# Patient Record
Sex: Female | Born: 1980 | Race: Black or African American | Hispanic: No | Marital: Single | Smoking: Never smoker
Health system: Southern US, Community
[De-identification: ages and names within clinical notes are randomized; demographics above are authoritative.]

## PROBLEM LIST (undated history)

## (undated) DIAGNOSIS — J45909 Unspecified asthma, uncomplicated: Secondary | ICD-10-CM

## (undated) DIAGNOSIS — I499 Cardiac arrhythmia, unspecified: Secondary | ICD-10-CM

## (undated) DIAGNOSIS — R011 Cardiac murmur, unspecified: Secondary | ICD-10-CM

## (undated) HISTORY — PX: TUBAL LIGATION: SHX77

## (undated) HISTORY — PX: TONSILLECTOMY: SUR1361

## (undated) HISTORY — PX: BACK SURGERY: SHX140

---
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
## 2002-09-03 ENCOUNTER — Emergency Department (HOSPITAL_COMMUNITY): Admission: EM | Admit: 2002-09-03 | Discharge: 2002-09-03 | Payer: Self-pay | Admitting: Emergency Medicine

## 2003-07-02 ENCOUNTER — Inpatient Hospital Stay (HOSPITAL_COMMUNITY): Admission: AD | Admit: 2003-07-02 | Discharge: 2003-07-02 | Payer: Self-pay | Admitting: Obstetrics and Gynecology

## 2003-12-16 ENCOUNTER — Ambulatory Visit: Payer: Self-pay | Admitting: Family Medicine

## 2003-12-16 ENCOUNTER — Other Ambulatory Visit: Admission: RE | Admit: 2003-12-16 | Discharge: 2003-12-16 | Payer: Self-pay | Admitting: Family Medicine

## 2003-12-16 ENCOUNTER — Ambulatory Visit (HOSPITAL_COMMUNITY): Admission: RE | Admit: 2003-12-16 | Discharge: 2003-12-16 | Payer: Self-pay | Admitting: Family Medicine

## 2004-01-19 ENCOUNTER — Ambulatory Visit: Payer: Self-pay | Admitting: Internal Medicine

## 2004-02-07 ENCOUNTER — Ambulatory Visit: Payer: Self-pay | Admitting: Family Medicine

## 2004-03-07 ENCOUNTER — Ambulatory Visit: Payer: Self-pay | Admitting: Family Medicine

## 2004-05-04 ENCOUNTER — Ambulatory Visit: Payer: Self-pay | Admitting: Sports Medicine

## 2004-06-12 ENCOUNTER — Ambulatory Visit: Payer: Self-pay | Admitting: Family Medicine

## 2004-06-13 ENCOUNTER — Ambulatory Visit: Payer: Self-pay | Admitting: Family Medicine

## 2004-06-15 ENCOUNTER — Ambulatory Visit: Payer: Self-pay | Admitting: Family Medicine

## 2004-08-03 ENCOUNTER — Ambulatory Visit: Payer: Self-pay | Admitting: Internal Medicine

## 2004-08-18 ENCOUNTER — Inpatient Hospital Stay (HOSPITAL_COMMUNITY): Admission: AD | Admit: 2004-08-18 | Discharge: 2004-08-18 | Payer: Self-pay | Admitting: Obstetrics & Gynecology

## 2004-08-30 ENCOUNTER — Emergency Department (HOSPITAL_COMMUNITY): Admission: EM | Admit: 2004-08-30 | Discharge: 2004-08-30 | Payer: Self-pay | Admitting: *Deleted

## 2004-09-24 ENCOUNTER — Inpatient Hospital Stay (HOSPITAL_COMMUNITY): Admission: AD | Admit: 2004-09-24 | Discharge: 2004-09-24 | Payer: Self-pay | Admitting: Family Medicine

## 2004-09-26 ENCOUNTER — Observation Stay (HOSPITAL_COMMUNITY): Admission: AD | Admit: 2004-09-26 | Discharge: 2004-09-27 | Payer: Self-pay | Admitting: Obstetrics and Gynecology

## 2004-09-26 ENCOUNTER — Ambulatory Visit: Payer: Self-pay | Admitting: Obstetrics and Gynecology

## 2004-10-02 ENCOUNTER — Ambulatory Visit: Payer: Self-pay | Admitting: Obstetrics and Gynecology

## 2004-10-18 ENCOUNTER — Inpatient Hospital Stay (HOSPITAL_COMMUNITY): Admission: AD | Admit: 2004-10-18 | Discharge: 2004-10-18 | Payer: Self-pay | Admitting: *Deleted

## 2004-10-31 ENCOUNTER — Inpatient Hospital Stay (HOSPITAL_COMMUNITY): Admission: AD | Admit: 2004-10-31 | Discharge: 2004-10-31 | Payer: Self-pay | Admitting: Obstetrics and Gynecology

## 2004-11-08 ENCOUNTER — Ambulatory Visit: Payer: Self-pay | Admitting: Family Medicine

## 2004-11-15 ENCOUNTER — Ambulatory Visit: Payer: Self-pay | Admitting: Sports Medicine

## 2004-11-21 ENCOUNTER — Ambulatory Visit: Payer: Self-pay | Admitting: Internal Medicine

## 2004-12-18 ENCOUNTER — Ambulatory Visit: Payer: Self-pay | Admitting: Family Medicine

## 2005-01-01 ENCOUNTER — Inpatient Hospital Stay (HOSPITAL_COMMUNITY): Admission: AD | Admit: 2005-01-01 | Discharge: 2005-01-01 | Payer: Self-pay | Admitting: *Deleted

## 2005-01-07 ENCOUNTER — Ambulatory Visit (HOSPITAL_COMMUNITY): Admission: RE | Admit: 2005-01-07 | Discharge: 2005-01-07 | Payer: Self-pay | Admitting: Obstetrics and Gynecology

## 2005-01-14 ENCOUNTER — Inpatient Hospital Stay (HOSPITAL_COMMUNITY): Admission: AD | Admit: 2005-01-14 | Discharge: 2005-01-14 | Payer: Self-pay | Admitting: Obstetrics and Gynecology

## 2005-01-17 ENCOUNTER — Ambulatory Visit: Payer: Self-pay | Admitting: Family Medicine

## 2005-01-18 ENCOUNTER — Ambulatory Visit: Payer: Self-pay | Admitting: Family Medicine

## 2005-01-30 ENCOUNTER — Ambulatory Visit: Payer: Self-pay | Admitting: Internal Medicine

## 2005-02-05 ENCOUNTER — Inpatient Hospital Stay (HOSPITAL_COMMUNITY): Admission: AD | Admit: 2005-02-05 | Discharge: 2005-02-06 | Payer: Self-pay | Admitting: *Deleted

## 2005-02-05 ENCOUNTER — Ambulatory Visit: Payer: Self-pay | Admitting: *Deleted

## 2005-02-12 ENCOUNTER — Ambulatory Visit: Payer: Self-pay | Admitting: Family Medicine

## 2005-02-28 ENCOUNTER — Encounter: Payer: Self-pay | Admitting: Internal Medicine

## 2005-02-28 ENCOUNTER — Ambulatory Visit: Payer: Self-pay | Admitting: Internal Medicine

## 2005-02-28 ENCOUNTER — Ambulatory Visit (HOSPITAL_COMMUNITY): Admission: RE | Admit: 2005-02-28 | Discharge: 2005-02-28 | Payer: Self-pay | Admitting: Sports Medicine

## 2005-03-14 ENCOUNTER — Ambulatory Visit: Payer: Self-pay | Admitting: Family Medicine

## 2005-03-19 ENCOUNTER — Ambulatory Visit: Payer: Self-pay | Admitting: Family Medicine

## 2005-03-19 ENCOUNTER — Inpatient Hospital Stay (HOSPITAL_COMMUNITY): Admission: AD | Admit: 2005-03-19 | Discharge: 2005-03-19 | Payer: Self-pay | Admitting: Family Medicine

## 2005-03-27 ENCOUNTER — Ambulatory Visit: Payer: Self-pay | Admitting: Family Medicine

## 2005-03-30 ENCOUNTER — Emergency Department (HOSPITAL_COMMUNITY): Admission: EM | Admit: 2005-03-30 | Discharge: 2005-03-30 | Payer: Self-pay | Admitting: Emergency Medicine

## 2005-04-26 ENCOUNTER — Ambulatory Visit: Payer: Self-pay | Admitting: Family Medicine

## 2005-05-08 ENCOUNTER — Ambulatory Visit: Payer: Self-pay | Admitting: Family Medicine

## 2005-05-14 ENCOUNTER — Inpatient Hospital Stay (HOSPITAL_COMMUNITY): Admission: AD | Admit: 2005-05-14 | Discharge: 2005-05-14 | Payer: Self-pay | Admitting: *Deleted

## 2005-05-24 ENCOUNTER — Ambulatory Visit: Payer: Self-pay | Admitting: Sports Medicine

## 2005-05-28 ENCOUNTER — Ambulatory Visit: Payer: Self-pay | Admitting: *Deleted

## 2005-05-29 ENCOUNTER — Ambulatory Visit: Payer: Self-pay | Admitting: Family Medicine

## 2005-06-03 ENCOUNTER — Ambulatory Visit: Payer: Self-pay | Admitting: Obstetrics and Gynecology

## 2005-06-03 ENCOUNTER — Inpatient Hospital Stay (HOSPITAL_COMMUNITY): Admission: AD | Admit: 2005-06-03 | Discharge: 2005-06-03 | Payer: Self-pay | Admitting: Family Medicine

## 2005-06-04 ENCOUNTER — Inpatient Hospital Stay (HOSPITAL_COMMUNITY): Admission: AD | Admit: 2005-06-04 | Discharge: 2005-06-08 | Payer: Self-pay | Admitting: Obstetrics and Gynecology

## 2005-06-04 ENCOUNTER — Ambulatory Visit: Payer: Self-pay | Admitting: Obstetrics and Gynecology

## 2005-06-05 ENCOUNTER — Encounter (INDEPENDENT_AMBULATORY_CARE_PROVIDER_SITE_OTHER): Payer: Self-pay | Admitting: *Deleted

## 2005-06-05 ENCOUNTER — Ambulatory Visit: Payer: Self-pay | Admitting: Internal Medicine

## 2005-08-13 ENCOUNTER — Ambulatory Visit: Payer: Self-pay | Admitting: Sports Medicine

## 2005-10-14 ENCOUNTER — Ambulatory Visit: Payer: Self-pay | Admitting: Family Medicine

## 2006-04-01 ENCOUNTER — Encounter (INDEPENDENT_AMBULATORY_CARE_PROVIDER_SITE_OTHER): Payer: Self-pay | Admitting: *Deleted

## 2006-04-01 LAB — CONVERTED CEMR LAB

## 2006-04-09 ENCOUNTER — Other Ambulatory Visit: Admission: RE | Admit: 2006-04-09 | Discharge: 2006-04-09 | Payer: Self-pay | Admitting: Family Medicine

## 2006-04-09 ENCOUNTER — Ambulatory Visit: Payer: Self-pay | Admitting: Sports Medicine

## 2006-04-09 ENCOUNTER — Encounter: Payer: Self-pay | Admitting: Family Medicine

## 2006-04-09 LAB — CONVERTED CEMR LAB
Chlamydia, DNA Probe: POSITIVE — AB
GC Probe Amp, Genital: NEGATIVE

## 2006-04-11 ENCOUNTER — Ambulatory Visit: Payer: Self-pay | Admitting: Family Medicine

## 2006-05-02 ENCOUNTER — Inpatient Hospital Stay (HOSPITAL_COMMUNITY): Admission: AD | Admit: 2006-05-02 | Discharge: 2006-05-02 | Payer: Self-pay | Admitting: Family Medicine

## 2006-05-23 ENCOUNTER — Encounter (INDEPENDENT_AMBULATORY_CARE_PROVIDER_SITE_OTHER): Payer: Self-pay | Admitting: *Deleted

## 2006-08-01 ENCOUNTER — Telehealth: Payer: Self-pay | Admitting: Family Medicine

## 2006-08-01 ENCOUNTER — Ambulatory Visit: Payer: Self-pay | Admitting: Sports Medicine

## 2006-08-01 ENCOUNTER — Encounter: Payer: Self-pay | Admitting: Family Medicine

## 2006-08-01 LAB — CONVERTED CEMR LAB
Chlamydia, DNA Probe: NEGATIVE
GC Probe Amp, Genital: NEGATIVE
Whiff Test: POSITIVE

## 2006-08-04 ENCOUNTER — Encounter: Payer: Self-pay | Admitting: Family Medicine

## 2006-09-04 ENCOUNTER — Encounter: Payer: Self-pay | Admitting: Family Medicine

## 2006-12-22 ENCOUNTER — Emergency Department (HOSPITAL_COMMUNITY): Admission: EM | Admit: 2006-12-22 | Discharge: 2006-12-22 | Payer: Self-pay | Admitting: Emergency Medicine

## 2006-12-24 ENCOUNTER — Ambulatory Visit: Payer: Self-pay | Admitting: Internal Medicine

## 2006-12-26 ENCOUNTER — Ambulatory Visit: Payer: Self-pay | Admitting: Family Medicine

## 2006-12-26 ENCOUNTER — Encounter: Payer: Self-pay | Admitting: Family Medicine

## 2006-12-26 DIAGNOSIS — J351 Hypertrophy of tonsils: Secondary | ICD-10-CM | POA: Insufficient documentation

## 2006-12-26 LAB — CONVERTED CEMR LAB: TSH: 1.31 microintl units/mL (ref 0.350–5.50)

## 2007-04-21 ENCOUNTER — Encounter: Payer: Self-pay | Admitting: *Deleted

## 2007-06-25 ENCOUNTER — Ambulatory Visit: Payer: Self-pay | Admitting: Family Medicine

## 2007-06-25 ENCOUNTER — Encounter (INDEPENDENT_AMBULATORY_CARE_PROVIDER_SITE_OTHER): Payer: Self-pay | Admitting: Family Medicine

## 2007-06-25 LAB — CONVERTED CEMR LAB
Chlamydia, DNA Probe: NEGATIVE
GC Probe Amp, Genital: NEGATIVE
Whiff Test: POSITIVE

## 2007-06-30 ENCOUNTER — Ambulatory Visit: Payer: Self-pay | Admitting: Family Medicine

## 2007-06-30 ENCOUNTER — Encounter (INDEPENDENT_AMBULATORY_CARE_PROVIDER_SITE_OTHER): Payer: Self-pay | Admitting: Family Medicine

## 2007-06-30 LAB — CONVERTED CEMR LAB: Rapid Strep: NEGATIVE

## 2007-08-04 ENCOUNTER — Encounter: Payer: Self-pay | Admitting: Family Medicine

## 2007-08-24 ENCOUNTER — Encounter (INDEPENDENT_AMBULATORY_CARE_PROVIDER_SITE_OTHER): Payer: Self-pay | Admitting: Otolaryngology

## 2007-08-24 ENCOUNTER — Ambulatory Visit (HOSPITAL_BASED_OUTPATIENT_CLINIC_OR_DEPARTMENT_OTHER): Admission: RE | Admit: 2007-08-24 | Discharge: 2007-08-24 | Payer: Self-pay | Admitting: Otolaryngology

## 2007-10-09 ENCOUNTER — Telehealth: Payer: Self-pay | Admitting: *Deleted

## 2007-10-09 ENCOUNTER — Encounter: Payer: Self-pay | Admitting: *Deleted

## 2007-10-09 ENCOUNTER — Emergency Department (HOSPITAL_COMMUNITY): Admission: EM | Admit: 2007-10-09 | Discharge: 2007-10-09 | Payer: Self-pay | Admitting: Emergency Medicine

## 2007-10-12 ENCOUNTER — Emergency Department (HOSPITAL_COMMUNITY): Admission: EM | Admit: 2007-10-12 | Discharge: 2007-10-12 | Payer: Self-pay | Admitting: Emergency Medicine

## 2007-10-21 ENCOUNTER — Ambulatory Visit: Payer: Self-pay | Admitting: Family Medicine

## 2007-12-22 ENCOUNTER — Ambulatory Visit: Payer: Self-pay | Admitting: Family Medicine

## 2007-12-22 ENCOUNTER — Telehealth: Payer: Self-pay | Admitting: *Deleted

## 2007-12-22 LAB — CONVERTED CEMR LAB: Whiff Test: POSITIVE

## 2007-12-25 ENCOUNTER — Telehealth: Payer: Self-pay | Admitting: *Deleted

## 2008-01-03 ENCOUNTER — Emergency Department (HOSPITAL_COMMUNITY): Admission: EM | Admit: 2008-01-03 | Discharge: 2008-01-03 | Payer: Self-pay | Admitting: Emergency Medicine

## 2008-02-21 ENCOUNTER — Emergency Department (HOSPITAL_COMMUNITY): Admission: EM | Admit: 2008-02-21 | Discharge: 2008-02-21 | Payer: Self-pay | Admitting: Family Medicine

## 2008-03-02 ENCOUNTER — Telehealth: Payer: Self-pay | Admitting: Family Medicine

## 2008-05-26 ENCOUNTER — Telehealth: Payer: Self-pay | Admitting: Family Medicine

## 2008-05-26 ENCOUNTER — Ambulatory Visit: Payer: Self-pay | Admitting: Family Medicine

## 2008-05-26 DIAGNOSIS — G44009 Cluster headache syndrome, unspecified, not intractable: Secondary | ICD-10-CM | POA: Insufficient documentation

## 2008-06-23 ENCOUNTER — Telehealth: Payer: Self-pay | Admitting: Family Medicine

## 2008-06-24 ENCOUNTER — Ambulatory Visit: Payer: Self-pay | Admitting: Family Medicine

## 2008-06-24 DIAGNOSIS — J309 Allergic rhinitis, unspecified: Secondary | ICD-10-CM | POA: Insufficient documentation

## 2008-06-24 DIAGNOSIS — R51 Headache: Secondary | ICD-10-CM | POA: Insufficient documentation

## 2008-06-24 DIAGNOSIS — R519 Headache, unspecified: Secondary | ICD-10-CM | POA: Insufficient documentation

## 2009-03-01 ENCOUNTER — Telehealth: Payer: Self-pay | Admitting: Family Medicine

## 2009-03-06 ENCOUNTER — Telehealth: Payer: Self-pay | Admitting: Family Medicine

## 2009-03-09 ENCOUNTER — Encounter: Payer: Self-pay | Admitting: Family Medicine

## 2009-03-10 ENCOUNTER — Telehealth: Payer: Self-pay | Admitting: Family Medicine

## 2009-03-10 ENCOUNTER — Ambulatory Visit: Payer: Self-pay | Admitting: Family Medicine

## 2009-03-21 ENCOUNTER — Ambulatory Visit: Payer: Self-pay | Admitting: Family Medicine

## 2009-03-21 DIAGNOSIS — F410 Panic disorder [episodic paroxysmal anxiety] without agoraphobia: Secondary | ICD-10-CM | POA: Insufficient documentation

## 2009-04-27 ENCOUNTER — Telehealth: Payer: Self-pay | Admitting: Family Medicine

## 2009-05-17 ENCOUNTER — Telehealth: Payer: Self-pay | Admitting: Family Medicine

## 2009-05-17 ENCOUNTER — Encounter: Payer: Self-pay | Admitting: Family Medicine

## 2009-05-18 ENCOUNTER — Encounter: Payer: Self-pay | Admitting: Family Medicine

## 2009-06-05 ENCOUNTER — Telehealth: Payer: Self-pay | Admitting: Family Medicine

## 2009-06-06 ENCOUNTER — Encounter: Payer: Self-pay | Admitting: Family Medicine

## 2009-06-20 ENCOUNTER — Telehealth: Payer: Self-pay | Admitting: Family Medicine

## 2009-09-04 ENCOUNTER — Encounter: Payer: Self-pay | Admitting: Family Medicine

## 2009-11-25 ENCOUNTER — Encounter: Payer: Self-pay | Admitting: Family Medicine

## 2009-11-25 DIAGNOSIS — J45909 Unspecified asthma, uncomplicated: Secondary | ICD-10-CM | POA: Insufficient documentation

## 2010-01-05 ENCOUNTER — Emergency Department (HOSPITAL_COMMUNITY)
Admission: EM | Admit: 2010-01-05 | Discharge: 2010-01-05 | Payer: Self-pay | Source: Home / Self Care | Admitting: Emergency Medicine

## 2010-02-09 ENCOUNTER — Emergency Department (HOSPITAL_COMMUNITY): Admission: EM | Admit: 2010-02-09 | Discharge: 2010-02-09 | Payer: Self-pay | Admitting: Emergency Medicine

## 2010-04-24 NOTE — Assessment & Plan Note (Signed)
Summary: tonsils swollen/problems with asthma/eo  Medications Added VENTOLIN HFA 108 (90 BASE) MCG/ACT  AERS (ALBUTEROL SULFATE) 2 puffs every 4 hours as needed for wheezing or shortness of breath disp: 2        Vital Signs:  Patient Profile:   30 Years Old Female Height:     62 inches Weight:      214.2 pounds Temp:     98.9 degrees F Pulse rate:   84 / minute BP sitting:   114 / 80  (left arm)  Pt. in pain?   yes    Location:   throat    Intensity:   sore7    Type:       soreness  Vitals Entered By: Theresia Lo RN (June 30, 2007 11:34 AM)              Is Patient Diabetic? No     PCP:  Myrtie Soman  MD  Chief Complaint:  sore throat, left ear ache, and asthma.  History of Present Illness: Molly Ford is a 30 year old who has enlarged tonsils and a history of frequent strep infections who presents with swollen left tonsil and sore throat and left ear since last Friday.  Denies fevers or chills. Symptoms improving.  No stridor or dysphagia.  No drainage in throat or from ear.  No swollen painful nodes that patient can feel.   Also reports needing to use albuterol nebs every 4-6 hours for the past 2 days.  States cold air triggers some shortness of breath and wheezing. It is relieved by the albuterol.  States this is typical for her this time of year.  States this has also improved today        Physical Exam  General:     Well-developed,well-nourished,in no acute distress; alert,appropriate and cooperative throughout examination Ears:     External ear exam shows no significant lesions or deformities.  Otoscopic examination reveals clear canals, tympanic membranes are intact bilaterally without bulging, retraction, inflammation or discharge. Hearing is grossly normal bilaterally. Nose:     External nasal examination shows no deformity or inflammation. Nasal mucosa are pink and moist without lesions or exudates. Mouth:     no exudates, pharyngeal erythema, and  tonsil hypertropied.   Neck:     no lymphadenopathy supple and no neck tenderness.   Lungs:     Normal respiratory effort, chest expands symmetrically. Lungs are clear to auscultation, no crackles or wheezes. Heart:     Normal rate and regular rhythm. S1 and S2 normal without gallop, murmur, click, rub or other extra sounds. Extremities:     No clubbing, cyanosis, edema, or deformity noted  Skin:     Intact without suspicious lesions or rashes Cervical Nodes:     No lymphadenopathy noted    Impression & Recommendations:  Problem # 1:  SORE THROAT (ICD-462) Assessment: Deteriorated rapid strep negative.  will send for culture.  afebrile, otherwise asymptomatic.  No treatment needed at this time.  encouraged patient to schedule appointment with ENT to discuss tonsillectomy Her updated medication list for this problem includes:    Metronidazole 500 Mg Tabs (Metronidazole) .Marland Kitchen... 1 tablet twice a day for 7 days.  Orders: Rapid Strep-[facility] (56387) [facility]- Est Level  3 (56433)   Problem # 2:  ASTHMA, UNSPECIFIED (ICD-493.90) Assessment: Deteriorated no respiratory distress, no wheezing on exam.  Seems to be mild intermittent asthma.  Inhaler refilled.  Will follow up with PCP to discuss asthma  and ensure no controller med needed. Her updated medication list for this problem includes:    Ventolin Hfa 108 (90 Base) Mcg/act Aers (Albuterol sulfate) .Marland Kitchen... 2 puffs every 4 hours as needed for wheezing or shortness of breath disp: 2  Orders: Pulse Oximetry- [facility] (94760) [facility]- Est Level  3 (35361)   Complete Medication List: 1)  Verapamil Hcl Cr 120 Mg Cp24 (Verapamil hcl) .... One tab daily 2)  Metronidazole 500 Mg Tabs (Metronidazole) .Marland Kitchen.. 1 tablet twice a day for 7 days. 3)  Ventolin Hfa 108 (90 Base) Mcg/act Aers (Albuterol sulfate) .... 2 puffs every 4 hours as needed for wheezing or shortness of breath disp: 2   Patient Instructions: 1)  Follow up with Dr. Rexene Alberts within the next  month to discuss your asthma. 2)  Call the ENT for an appointment--if they have any questions, have them contact our office. 3)  Your strep test was negative today.    Prescriptions: VENTOLIN HFA 108 (90 BASE) MCG/ACT  AERS (ALBUTEROL SULFATE) 2 puffs every 4 hours as needed for wheezing or shortness of breath disp: 2  #1 x 3   Entered and Authorized by:   Levander Campion MD   Signed by:   Levander Campion MD on 06/30/2007   Method used:   Print then Give to Patient   RxID:   [id number] VENTOLIN HFA 108 (90 BASE) MCG/ACT  AERS (ALBUTEROL SULFATE) 2 puffs every 4 hours as needed for wheezing or shortness of breath disp: 2  #1 x 3   Entered and Authorized by:   Levander Campion MD   Signed by:   Levander Campion MD on 06/30/2007   Method used:   Print then Give to Patient   RxID:   [id number]  ]  Vital Signs:  Patient Profile:   30 Years Old Female Height:     62 inches Weight:      214.2 pounds Temp:     98.9 degrees F Pulse rate:   84 / minute BP sitting:   114 / 80    Location:   throat    Intensity:   sore7    Type:       soreness                Laboratory Results  Date/Time Received: June 30, 2007 11:47 AM  Date/Time Reported: June 30, 2007 11:55 AM   Other Tests  Rapid Strep: negative Comments: ...................................................................[facility]  June 30, 2007 11:55 AM

## 2010-04-24 NOTE — Letter (Signed)
Summary: Generic Letter  [facility]  [address]   [place], [place] [address]   Phone: [phone]  Fax:     09/04/2006  [address] [place], [place]  [address]  Dear Ms. 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  I have had the good fortune to work here since July of 2005.  This means my time here in the clinic is ending soon.  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  While I will still be working in [place], I will not be able to continue full time work at the [facility].    Fortunately, new doctors are coming into the practice.  You will be reassigned to a new doctor within the next month.  The staff in the clinic (our nurses and Museum/gallery curator) will not change.  You should still see familiar faces when you come to the clinic, even if you do not see me.  I have enjoyed my time at [facility] participating in your care.  Please call the clinic if you have any questions.  Thank you for allowing me to serve as your physician.  Sincerely,   Crawford Givens MD [facility]  Appended Document: Generic Letter letter sent  admin team
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
## 2010-04-24 NOTE — Miscellaneous (Signed)
Summary: chart update   Clinical Lists Changes  Medications: Removed medication of ATIVAN 0.5 MG TABS (LORAZEPAM) take one tablet 3-4 times a day as needed Removed medication of BACTROBAN NASAL 2 % OINT (MUPIROCIN CALCIUM) apply to both nares three time a day for 7 days, disp QS Observations: Added new observation of SOCIAL HX: 2 sons. Recently lost her youngest (28 yo) son unexpectedly 12/10 due to MRSA sepsis. Seemed to have appropriate grieving process.  (09/04/2009 11:49) Added new observation of PRIMARY MD: Myrtie Soman  MD (09/04/2009 11:49)        Social History: 2 sons. Recently lost her youngest (28 yo) son unexpectedly 12/10 due to MRSA sepsis. Seemed to have appropriate grieving process.

## 2010-04-24 NOTE — Progress Notes (Signed)
Summary: triage   Phone Note Call from Patient Call back at [phone]   Caller: Patient Summary of Call: Pt wants to come in with her son and be checked for MRSA since her other son just passed with it and Dr. Rexene Alberts said he would get them in asap. Initial call taken by: Clydell Hakim,  March 10, 2009 2:18 PM  Follow-up for Phone Call        I gave her my condolances. she has been researching MRSA & wants to be checked. has no open sores. also wants other son with exzema checked as he does have breaks in the skin. to be here before 3pm for a WORK IN. KNOWS PCP WILL NOT BE HERE Follow-up by: Golden Circle RN,  March 10, 2009 2:19 PM
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
## 2010-04-24 NOTE — Miscellaneous (Signed)
Summary: re: records request  Molly Ford comes in with Molly Ford requesting a form that summarized the events surrounding her son's unfortunate death from sepsis in 03-13-2023. She states that I gave her a form which summarized what we knew about Molly Ford's clinical course. I did give her an instruction sheet which summarized the basic details of Molly Ford's diagnoses death according to what we knew at that time. I do not know of any other sheet. I gave Molly Ford a copy of Molly Ford's death summary and also a copy of the emergency department note the day he came in to the hospital. They have tried to get a copy of the medical record from the hospital but state that they have been unsuccessful in getting these records so far. I stated that I would call medical records to see if they could provide any information re: this request. They expressed understanding and appreciation.

## 2010-04-24 NOTE — Letter (Signed)
Summary: Generic Letter  [facility]  [address]   [place], [place] [address]   Phone: [phone]  Fax:     08/04/2006  [address] [place], [place]  [address]  Dear Ms. BRIGHAM,  Your labs showed yeast and bacterial vaginosis- these are not STDs.  The medicines for both were called into the pharmacy.  Your other tests- gonorrhea and chlamydia- were both normal.    It was good to see you.  Take care and call me if you have any questions.  Sincerely,   Crawford Givens MD [facility]  Appended Document: Generic Letter Patient letter mailed

## 2010-04-24 NOTE — Progress Notes (Signed)
Summary: Triage   Phone Note Call from Patient Call back at Home Phone [phone]   Reason for Call: Talk to Nurse Summary of Call: wants to speak with rn about asthma Initial call taken by: Haydee Salter,  December 25, 2007 8:31 AM  Follow-up for Phone Call        states the albuterol pump is not helping at all. using every 10 minutes with no relief. her speech was clear and she spoke in full sentences with no apparent distress.  appt made Follow-up by: Golden Circle RN,  December 25, 2007 8:35 AM

## 2010-04-24 NOTE — Progress Notes (Signed)
Summary: triage   Phone Note Call from Patient Call back at Home Phone [phone]   Caller: Patient Summary of Call: migrains have started and needs to come in for a shot Initial call taken by: De Nurse,  June 23, 2008 3:13 PM  Follow-up for Phone Call        she just started a HA. wants early am appt. she will call if it is gone. states it is not bad now & has taken ibuprofen. is going to relax & see if it goes away. told her we have md on call after hours if worse or if she has questions.Golden Circle RN  June 23, 2008 3:31 PM appt made for 8:30 Follow-up by: Golden Circle RN,  June 23, 2008 3:19 PM

## 2010-04-24 NOTE — Assessment & Plan Note (Signed)
Summary: migraine   Vital Signs:  Patient Profile:   30 Years Old Female Height:     62 inches Weight:      223 pounds Temp:     98.6 degrees F Pulse rate:   93 / minute BP sitting:   128 / 84  Pt. in pain?   yes    Location:   left side of head    Intensity:   8  Vitals Entered By: Jone Baseman CMA (May 26, 2008 9:32 AM)                  PCP:  Myrtie Soman  MD  Chief Complaint:  migraine.  History of Present Illness: 30 yo presents as work in for migraine.  Says she has been having them for years, on and off.  Not taking preventative or abortive medications.  This headache started about 4 days ago, lasts about 20 minutes off and on.  Unilateral, left orbital, accompanied by tearing.  She is tearing in office today.  Denies any nausea, photophobia, or visual changes.  Is restless, would not like to be in a dark room.  Medical history and medicaitons reviewed.       Review of Systems      See HPI   Physical Exam  General:     alert, left  eye tearing, appears uncomfortable. Eyes:     pupils equal, pupils round, pupils reactive to light, bilateral conjunctival injection, no exudates. left eye tearing.   Neurologic:     No cranial nerve deficits noted. Station and gait are normal. Plantar reflexes are down-going bilaterally. DTRs are symmetrical throughout. Sensory, motor and coordinative functions appear intact.    Impression & Recommendations:  Problem # 1:  CLUSTER HEADACHE (ICD-339.00) May have migraines as well, but this headache is more consistent with a cluster headache, given restlessness, ipilateral tearing and episodic in nature. Does not fit migraine.  Will treat with Oxygen and IM triptan in office today.  If continues, would consider starting prophylactic verapamil.  Will defer to PCP. Orders: [facility]- Est Level  3 ([address]) EMR miscellaneous medications (EMRORAL)   Complete Medication List: 1)  Ventolin Hfa 108 (90 Base) Mcg/act Aers  (Albuterol sulfate) .... 2 puffs every 4 hours as needed for wheezing or shortness of breath disp: 2 2)  Patanol 0.1 % Soln (Olopatadine hcl) .Marland Kitchen.. 1 drop in each eye daily 3)  Metronidazole 500 Mg Tabs (Metronidazole) .... One by mouth two times a day x 7 days 4)  Diflucan 100 Mg Tabs (Fluconazole) .... One by mouth on day 1, then one by mouth on day 3 then stop (start this after finishing your 7 days of flagyl)   Patient Instructions: 1)  Nice to meet you, Ms. Wiersma. 2)  I hope you feel better. 3)  Make an appointment with Dr. Rexene Alberts to discuss your headaches.     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

## 2010-04-24 NOTE — Progress Notes (Signed)
Summary: triage   Phone Note Call from Patient Call back at [phone]   Caller: Patient Summary of Call: chills/migrain/cough/weakness Initial call taken by: De Nurse,  May 26, 2008 8:36 AM  Follow-up for Phone Call        she will come now for workin. does not have any meds for migraine Follow-up by: Golden Circle RN,  May 26, 2008 8:49 AM

## 2010-04-24 NOTE — Assessment & Plan Note (Signed)
Summary: sleepless night,df   Vital Signs:  Patient profile:   30 year old female Weight:      217.3 pounds Temp:     98.5 degrees F oral Pulse rate:   76 / minute Pulse rhythm:   regular BP sitting:   127 / 86  (right arm) Cuff size:   large  Vitals Entered By: Loralee Pacas CMA (March 21, 2009 10:19 AM) CC: not sleeping since son passed 11-Mar-2009   Primary Care Provider:  Myrtie Soman  MD  CC:  not sleeping since son passed 2009/03/11.  History of Present Illness: 1. sleepless nights Since the death of her son, [name], 12-Mar-2023, Ms. Crihfield reports that she's had significant difficulty sleeping at night. Thinks she only gets about 1.5 hours of sleep per night. Tries to go to bed b/w 8 and 9 pm. Tosses and turns with racing thoughts. Will frequently have the sensation of impending doom and palpitations which she can't explain. Will also have similar moments during the day, although less frequent if she's doing something. She states that she's recently gone back to work at [employer] and this has been helping to keep her "mind off things." Hasn't taken any medicine to help.   ROS: denies SI, AVH or other psychotic symptoms.  Denies chest pain or SOB.   further history... States the holidays have been hard, as she's had to give away Christmas presents for [name] to other children his age. Has had a hard time trying to get his brother Eda Keys to understand all that's gone on. I s planning on meeting with Dr. Raymon Mutton ([name] attending) after the holidays.  States that she doesn't question God, but admits it continues to be very difficult. States, "I guess God just needed another angel."  Current Medications (verified): 1)  Ventolin Hfa 108 (90 Base) Mcg/act  Aers (Albuterol Sulfate) .... 2 Puffs Every 4 Hours As Needed For Wheezing or Shortness of Breath Disp: 2 2)  [facility] Loratadine 10 Mg Tabs (Loratadine) .... Take One Tablet Daily For Allergies 3)  Verapamil Hcl Cr 120 Mg Xr24h-Cap (Verapamil Hcl) ....  Take One Tablet Every Evening For Headache Prevention 4)  Bactroban Nasal 2 % Oint (Mupirocin Calcium) .... Apply To Both Nares Three Time A Day For 7 Days 5)  Ativan 0.5 Mg Tabs (Lorazepam) .... Take One Tablet 3-4 Times A Day As Needed  Allergies (verified): No Known Drug Allergies  Physical Exam  Additional Exam:  General:  Vital signs reviewed -- obese but otherwise normal Alert, appropriate; well-dressed and well-nourished Psych: somewhat depressed affect. Good eye contact. Speech normal rate. Thought process, content is linear and goal-oriented. No evidence of AVH, IOR or overt anxiety. Denies SI/HI.    Impression & Recommendations:  Problem # 1:  PANIC ATTACK (ICD-300.01)  Likely part of grief reaction, adjustment d/o. This is interfering with sleep. No SI, pychosis. Pt reluctant to take meds but agreeable to try something.  Will initiate short acting benzo to help with anxiolysis and, hopefully, sleep initiation. May need further help with achieving restorative sleep. See back in one week. To call for problems before then if having trouble.   Her updated medication list for this problem includes:    Ativan 0.5 Mg Tabs (Lorazepam) .Marland Kitchen... Take one tablet 3-4 times a day as needed  Her updated medication list for this problem includes:    Ativan 0.5 Mg Tabs (Lorazepam) .Marland Kitchen... Take one tablet 3-4 times a day as needed  Orders: [facility]- Est Level  3 ([address])  Problem # 2:  Family Hx of MRSA (ICD-041.12) Assessment: Comment Only bactroban sent to wrong pharmacy..will resend, has been using the wash prescribed  by Dr. Earnest Bailey.  Complete Medication List: 1)  Ventolin Hfa 108 (90 Base) Mcg/act Aers (Albuterol sulfate) .... 2 puffs every 4 hours as needed for wheezing or shortness of breath disp: 2 2)  [facility] Loratadine 10 Mg Tabs (Loratadine) .... Take one tablet daily for allergies 3)  Verapamil Hcl Cr 120 Mg Xr24h-cap (Verapamil hcl) .... Take one tablet every evening for headache  prevention 4)  Bactroban Nasal 2 % Oint (Mupirocin calcium) .... Apply to both nares three time a day for 7 days, disp qs 5)  Ativan 0.5 Mg Tabs (Lorazepam) .... Take one tablet 3-4 times a day as needed  Patient Instructions: 1)  follow-up with me in one week. 2)  use the bactroban in the nose.  3)  please call me if you're still not able to sleep. Prescriptions: BACTROBAN NASAL 2 % OINT (MUPIROCIN CALCIUM) apply to both nares three time a day for 7 days, disp QS  #1 x 1   Entered and Authorized by:   Myrtie Soman  MD   Signed by:   Myrtie Soman  MD on 03/21/2009   Method used:   Electronically to        [facility] [address] [phone]* (retail)       [address]       [place], [place]  [address]       Ph: [phone]       Fax: [phone]   RxID:   [id number] ATIVAN 0.5 MG TABS (LORAZEPAM) take one tablet 3-4 times a day as needed  #30 x 0   Entered and Authorized by:   Myrtie Soman  MD   Signed by:   Myrtie Soman  MD on 03/21/2009   Method used:   Print then Give to Patient   RxID:   [id number]

## 2010-04-24 NOTE — Progress Notes (Signed)
Summary: call attempted   Phone Note Outgoing Call   Call placed by: Myrtie Soman  MD,  June 05, 2009 2:41 PM Summary of Call: attempted to call pt and both numbers I have listed as today is [name]'s birthday. Unfortunately I received a "number is incorrect" message both times.  Initial call taken by: Myrtie Soman  MD,  June 05, 2009 2:41 PM

## 2010-04-24 NOTE — Progress Notes (Signed)
Summary: condolences   Phone Note Outgoing Call   Call placed by: Myrtie Soman  MD,  March 01, 2009 2:37 PM Summary of Call: Spoke with pt to offer my condolences for the recent loss of her 28 yo son, [name]. She stated that she was grateful for this. She also asked that we keep his records for future reference. She expressed concern that something may have been missed. I stated that his records would be kept and I offerred to have her in to the clinic to review the information that we have with her. I also told her that our clinic would be available for her own needs as she deals with this difficult situation. I told her that she could always talk to someone here if needed, as we have a provider on call 24/7. I also suggested that I call her early next week to set up an appointment to go over the information that we have. She stated that she would like to do this and will look forward to a call from me next week. Her number is [phone].

## 2010-04-24 NOTE — Assessment & Plan Note (Signed)
Summary: f/u per MD   Primary Care Molly Ford:  Molly Soman  MD   History of Present Illness: 1. son's recent death Pt's son, Molly Ford recently passed away 03-14-2023. Was seen in ED 12/2 and here in clinic 12/3 for leg pain and fever. His most recent hospital admission is not available to review except for labs and imaging. From my discussions with Dr. Raymon Mutton and review of the available records, Molly Ford was seen in again in the ED 12/6 and admitted to the [name] with repiratory failure and signs of sepsis. After admission to the [name] he continued to deteriorate and ultimately expired March 14, 2023 after resuscitation efforts by the [name] failed.   I have spoken already with Dr. Raymon Mutton and discussed his knowledge of the prelimary autopsy results - which indicated overwhelming sepsis with MRSA. He is aware of this meeting today and is happy to be availble to the family for further discussion.  The purpose of today's visit was to answer, to the best of my ability, the questions that Molly Ford and the family had and to be of assistance to her in this difficult time. Per [name]'s preference, present for the meeting was [name], her cousin Molly Ford, and Molly Ford grandmother Molly Ford. [name] states that she is grieving but holding up okay. She states that she doesn't want to speak to a counselor or talk to someone about what she's going through because "talking ain't going to bring my son back." She states "I just want to know what happened."  At that point I told them what I knew about the situation. I stated that it appeared that [name] had an infection that overwhelmed his body. I told them that he had sepsis which meant an infection that caused his organs to fail. I also told them that it appeared to be an infection caused by resistant staph aureus.   The family stated that they had not heard most of this information yet. They had questions about what could cause a staph infection. They also asked why  he got sick so quickly. I stated that staph, even resistant staph, is quite common but it is rare for it to cause serious illness, and even more rare for it to be responsible for the death of a previously healthy child like Molly Ford. The family asked if "something was missed." I responded by saying that, while I was not present for any of Molly Ford's visits, from my understanding, Molly Ford's care was handled appropriately.  I stated that I didn't know why Molly Ford got so sick so quickly. They asked how long he had the bacteria and I stated that I didn't know. I told them that the final autopsy results are pending at this time.   The meeting was concluded by my reiteration that I wanted to meet with them today to answer their questions and review the information that we had. I asked them if they had further questions and they did not at this time. I also again asked [name] to let me know if she needed anything from me, as her doctor, as she deals with this tragedy. The family stated that they have Dr. Isidore Moos number and would be planning to meet with him soon to discuss things further. I asked them to let me know if they needed any help arranging this. They were agreeable to this.     Complete Medication List: 1)  Ventolin Hfa 108 (90 Base) Mcg/act Aers (Albuterol sulfate) .... 2 puffs every 4  hours as needed for wheezing or shortness of breath disp: 2 2)  Cvs Loratadine 10 Mg Tabs (Loratadine) .... Take one tablet daily for allergies 3)  Verapamil Hcl Cr 120 Mg Xr24h-cap (Verapamil hcl) .... Take one tablet every evening for headache prevention  Patient Instructions: 1)  I am very sorry for your loss. Please know that I am available for questions and concerns. Both for yourself and concerning Molly Ford. 2)  Here's what we know so far about Molly Ford: 3)  Molly Ford had an overwhelming infection that caused his organs to fail. 4)  The infection was caused by a bacteria called staph aureus. The bacteria was resistent  to some antibiotics. 5)  This bacteria is common in our community and generally does not cause serious problems. 6)  ***Please let me know if I can help arrange a meeting with Dr. Raymon Mutton.

## 2010-04-24 NOTE — Progress Notes (Signed)
Summary: phn msg   Phone Note Call from Patient Call back at [phone]   Caller: Patient Summary of Call: Wants to talk to Dr. Rexene Alberts about her son. Initial call taken by: Clydell Hakim,  June 20, 2009 11:22 AM  Follow-up for Phone Call        Spoke with [name]. She is concerned about behavioral issues with her son Molly Ford. Please see his chart for further details.  Follow-up by: Myrtie Soman  MD,  June 20, 2009 2:11 PM

## 2010-04-24 NOTE — Progress Notes (Signed)
Summary: phn msg   Phone Note Call from Patient Call back at [phone]   Caller: Patient Summary of Call: pt is requesting to talk to Dr Rexene Alberts Initial call taken by: De Nurse,  May 17, 2009 12:06 PM  Follow-up for Phone Call        pt states that she only got visit from 12/17 on. I stated that this was in error. Certainly she is entitled to the records from any visit. Asked her to come back to pick up the note from our meeting on 12/16. She is agreeable to this. Will let front office staff know she's coming back.  Follow-up by: Myrtie Soman  MD,  May 17, 2009 12:21 PM

## 2010-04-24 NOTE — Assessment & Plan Note (Signed)
Summary: asthma, ? yeast infection   Vital Signs:  Patient Profile:   30 Years Old Female Height:     62 inches Weight:      219 pounds BMI:     40.20 O2 Sat:      98 % O2 treatment:    Room Air Temp:     97.9 degrees F oral Pulse rate:   80 / minute BP sitting:   116 / 84  (right arm) Cuff size:   regular  Pt. in pain?   no  Vitals Entered By: Dedra Skeens CMA, (December 22, 2007 9:36 AM)                     PCP:  Myrtie Soman  MD  Chief Complaint:  Asthma Management.  History of Present Illness: 1. Pt presents with ? asthma exacerbation  Became short of breath this am. Did not have an inhaler at home and pharmacy stated that she would have to have new rx. Received albuterol in room and feels much better. Is at baseline after treatment per her report. States that she would not have come in if she would have had an inhaler at home.  Of note the patient had her tonsils removed by ENT in 2008. No sore throats since that time.  2. ? yeast infection Pt has been have several days of whitish discharge and mild itchiness. States she was diagnosed with BV in 5/09. Not on chronic steroid therapy. Has recently been healthy other than this am's shortness of breath.    Past Medical History:    cardiac arrythmia-nonsustained VT, GC/Chl neg 9-05, TSH WNL 9-05    -RV outflow tract/septal ventricular tachycardia - followed by [facility]    -tonsillar hypertrophy  s/p tonsillectomy 2008  Past Surgical History:    2D echo-nml LV function, mild MR - 09/23/2002, c section 2-05 - 12/16/2003, EKG - NSR, freq PVCs, Rightward axis - 02/01/2004, EKG 9-05 frequent PVCs - 12/16/2003, T&A - 07/31/2004          Physical Exam  Lungs:     pre-neb: somewhat decreased BS bilateral lower lobes; slightly increased RR; no wheeze, no stridor  post-neb: baseline, unlabored work of breathing; normal RR; no wheeze. good air movement throughout. Heart:     RRR; no murmurs Genitalia:     Pelvic  Exam:        External: normal female genitalia without lesions or masses        Vagina: normal without lesions or masses; mild whitish discharge with mild odor in vault        Cervix: normal without lesions or masses        Adnexa: normal bimanual exam without masses or fullness        Uterus: normal by palpation        Wet prep perform.     Impression & Recommendations:  Problem # 1:  ASTHMA, UNSPECIFIED (ICD-493.90) Assessment: Deteriorated Likely a mild exacerbation. Rx sent to pharmacy. Better w/ neb in office. Also gave IM decadron x 1 but no need to continue steroids at this time.  Her updated medication list for this problem includes:    Ventolin Hfa 108 (90 Base) Mcg/act Aers (Albuterol sulfate) .Marland Kitchen... 2 puffs every 4 hours as needed for wheezing or shortness of breath disp: 2  Orders: [facility]- Est Level  3 (16109)   Problem # 2:  VAGINAL DISCHARGE (ICD-623.5) Assessment: New Wet prep performed. Shows clue cells and yeast.  Will treat with flagyl x 7 days then followed by 2 doses of diflucan. Called pt and LMOVM regarding this. Orders: Wet Prep- [facility] (87210) [facility]- Est Level  3 (81829)   Complete Medication List: 1)  Ventolin Hfa 108 (90 Base) Mcg/act Aers (Albuterol sulfate) .... 2 puffs every 4 hours as needed for wheezing or shortness of breath disp: 2 2)  Patanol 0.1 % Soln (Olopatadine hcl) .Marland Kitchen.. 1 drop in each eye daily 3)  Metronidazole 500 Mg Tabs (Metronidazole) .... One by mouth two times a day x 7 days 4)  Diflucan 100 Mg Tabs (Fluconazole) .... One by mouth on day 1, then one by mouth on day 3 then stop (start this after finishing your 7 days of flagyl)    Prescriptions: DIFLUCAN 100 MG TABS (FLUCONAZOLE) one by mouth on day 1, then one by mouth on day 3 then stop (start this after finishing your 7 days of flagyl)  #2 x 0   Entered by:   Myrtie Soman  MD   Authorized by:   Denny Levy MD   Signed by:   Myrtie Soman  MD on 12/22/2007   Method used:    Electronically to        [facility]  [address] [phone]* (retail)       [address]       [place], [place]  [address]       Ph: [phone] or [phone]       Fax: [phone]   RxID:   [id number] METRONIDAZOLE 500 MG TABS (METRONIDAZOLE) one by mouth two times a day x 7 days  #14 x 0   Entered by:   Myrtie Soman  MD   Authorized by:   Denny Levy MD   Signed by:   Myrtie Soman  MD on 12/22/2007   Method used:   [facility] to        [facility]  [address] [phone]* (retail)       [address]       [place], [place]  [address]       Ph: [phone] or [phone]       Fax: [phone]   RxID:   [id number] VENTOLIN HFA 108 (90 BASE) MCG/ACT  AERS (ALBUTEROL SULFATE) 2 puffs every 4 hours as needed for wheezing or shortness of breath disp: 2  #1 x 3   Entered by:   Myrtie Soman  MD   Authorized by:   Denny Levy MD   Signed by:   Myrtie Soman  MD on 12/22/2007   Method used:   [facility]        [facility]  [address] [phone]* (retail)       [address]       [place], [place]  [address]       Ph: [phone] or [phone]       Fax: [phone]   RxID:   [id number]  ]    no urine results/ form clicked on in error -  Bonnie [place], [place] (ASCP)    Laboratory Results   Urine Tests  Date/Time Received:     Date/Time Received: December 22, 2007 10:12 AM  Date/Time Reported: December 22, 2007 10:34 AM   Wet Mount Source: vag WBC/hpf: 5-10 Bacteria/hpf: 3+  Cocci Clue cells/hpf: many  Positive whiff Yeast/hpf: few Trichomonas/hpf:  none Comments: ...............test performed by......Marland KitchenBonnie A. [place], [place] (ASCP)

## 2010-04-24 NOTE — Assessment & Plan Note (Signed)
Summary: DNKA-depression

## 2010-04-24 NOTE — Letter (Signed)
Summary: re: [name]'s birthday  [facility]  [address]   [place], [place] [address]   Phone: [phone]  Fax: [phone]    06/06/2009  BRYTNEE BECHLER [address] [place], [place]  [address]  Dear Ms. Veto Kemps,  I tried to call but didn't get any answer. So I wanted to send you a letter to let you know that I have been thinking about you. I know that [name]'s birthday is has just come and I'm sure that this time has been difficult. Please know that I'm available if you need to discuss anything. I think you've dealt with everything very well up to this point, but I'm sure there have been many difficult moments through all of this. But I want to make sure you know that you've been in my thoughts and prayers. Please let me know if I can be of help.   Sincerely,     Myrtie Soman  MD

## 2010-04-24 NOTE — Assessment & Plan Note (Signed)
Summary: PHYSICAL/PAP [facility]   Vital Signs:  Patient Profile:   30 Years Old Female Weight:      201 pounds Temp:     98.5 degrees F Pulse rate:   74 / minute BP sitting:   120 / 81  Pt. in pain?   no  Vitals Entered By: Jone Baseman CMA (Aug 01, 2006 9:31 AM)                Chief Complaint:  F/U on BV.  History of Present Illness: discharge- prev treated for chlamydia.  now with discharge for 1 week.  no odor, no pain.  new sexual partner.  no dysuria.  no flank pain, no fevers.   no other complaints.  no other systemic symptoms.     Past Medical History:    Reviewed history from 05/22/2006 and no changes required:       cardiac arrythmia-nonsustained VT, GC/Chl neg 9-05, TSH WNL 9-05   Family History:    Reviewed history from 05/22/2006 and no changes required:       father 8- healthy, mother 67- sickle cell dz, sister 63, sickle cell trait  Social History:    Reviewed history from 05/22/2006 and no changes required:       Lives w/ 2yo son.  Has a boyfriend.  Sexually active, using condoms.  Denies smoking or ETOH. No drug history. Plan B given.  Smokes Marijuana.   Risk Factors:  Tobacco use:  quit    Year quit:  2004   Review of Systems  The patient denies anorexia, fever, weight loss, hoarseness, chest pain, syncope, dyspnea on exhertion, peripheral edema, prolonged cough, hemoptysis, abdominal pain, and melena.         menses regular.  h/o BTL.   Physical Exam  General:     alert, well-developed, well-nourished, and well-hydrated.   Head:     normocephalic and atraumatic.   Genitalia:     normal introitus, no external lesions, mucosa pink and moist, no vaginal or cervical lesions, no vaginal atrophy, and no friaility or hemorrhage.  slight vaginal discharge. WP/GC/Chlam collected    Impression & Recommendations:  Problem # 1:  VAGINAL DISCHARGE (ICD-623.5) Assessment: New treat for BV and yeast.  see dr. first.  safe sex encouraged.  will  d/w patient re:GC/Chlam result when she returns to [facility].  patient agrees. Orders: [facility]- Est Level  3 (01027) GC/Chlamydia-[facility] ([id number]) [facility]    Patient Instructions: 1)  Please schedule a follow-up appointment as needed.  Laboratory Results  Date/Time Received: Aug 01, 2006 10.40  AM  Date/Time Reported: Aug 01, 2006 11:15 AM   Wet Mount/KOH Source: vag WBC/hpf 1-5 Bacteria/hpf 3+  Cocci Clue cells/hpf many  Positive whiff Yeast/hpf moderate KOH yeast spores present Trichomonas/hpf none Comments ...............test performed by......Marland KitchenBonnie A. [place], [place] (ASCP)

## 2010-04-24 NOTE — Miscellaneous (Signed)
Summary: ROI  ROI   Imported By: Bradly Bienenstock 05/18/2009 10:00:21  _____________________________________________________________________  External Attachment:    Type:   Image     Comment:   External Document

## 2010-04-24 NOTE — Progress Notes (Signed)
Summary: attempt to call pt.    Phone Note Outgoing Call   Call placed by: Myrtie Soman  MD,  April 27, 2009 12:34 PM Summary of Call: Attempted to call Ms. Bady to check in and see how she's doing. Direct number according to [name] is: [phone] Initial call taken by: Myrtie Soman  MD,  April 27, 2009 12:35 PM

## 2010-04-24 NOTE — Progress Notes (Signed)
   Phone Note Outgoing Call   Call placed by: Myrtie Soman  MD,  March 06, 2009 12:20 PM Summary of Call: spoke with pt. States that she's doing okay adjusting to the loss. Nights are the hardest. Would still be interested in coming into the office to review the information that we have available. Would like to do Thursday am at 11:00 am. Will have blue team work on booking this appointment. Will have staff call to confirm appointment date and time. Pt expresses thanks for this appointment. phone number is [phone]. Initial call taken by: Myrtie Soman  MD,  March 06, 2009 12:22 PM     Appended Document:  Called patient to confirm appt and she states she forgot that she had to go to the funeral home thurs. and would like to try another day this week, message to MD for best time.  Appended Document:  Patient now states she will keep appt at 11 on thurs.

## 2010-04-24 NOTE — Letter (Signed)
Summary: Out of Work  [facility]  [address]   [place], [place] [address]   Phone: [phone]  Fax: [phone]    June 30, 2007   Employee:  Crissie Figures D Lantier    To Whom It May Concern:   For Medical reasons, please excuse the above named employee from work for the following dates:  Start:   06/30/07  End:   07/01/07  If you need additional information, please feel free to contact our office.         Sincerely,    JESSICA Luz Brazen MD

## 2010-04-24 NOTE — Miscellaneous (Signed)
Summary: DNKA  Clinical Lists Changes 

## 2010-04-24 NOTE — Progress Notes (Signed)
Summary: Triage   Phone Note Call from Patient Call back at Home Phone [phone]   Summary of Call: is needing to discuss an inhaler, states she really needs one Initial call taken by: Haydee Salter,  December 22, 2007 8:39 AM  Follow-up for Phone Call        left message Follow-up by: Golden Circle RN,  December 22, 2007 9:03 AM  Additional Follow-up for Phone Call Additional follow up Details #1::        pt walked into clinic. seen by Dr. Jennette Kettle Additional Follow-up by: Golden Circle RN,  December 22, 2007 9:30 AM

## 2010-04-24 NOTE — Miscellaneous (Signed)
Summary: Correction in asthma Dx  Clinical Lists Changes  Problems: Removed problem of ASTHMA, UNSPECIFIED (ICD-493.90) Added new problem of ASTHMA, INTERMITTENT (ICD-493.90) 

## 2010-04-24 NOTE — Assessment & Plan Note (Signed)
Summary: cluster HA per pt   Vital Signs:  Patient profile:   30 year old female Height:      62 inches (157.48 cm) Weight:      222.6 pounds (101.18 kg) BMI:     40.86 Temp:     98.2 degrees F (36.78 degrees C) Pulse rate:   85 / minute BP sitting:   119 / 85  (left arm)  Vitals Entered By: Jacki Cones RN (June 24, 2008 8:44 AM) CC: left side headache Is Patient Diabetic? No Pain Assessment Patient in pain? yes     Location: head Intensity: 8 Type: aching   History of Present Illness: 30 yo presents as work in for HA.  Says she has been having them for about a year, on and off.  Not taking preventative or abortive medications.  This headache started yesterday and was not really bad. Will stay 2-3 days at a time. Unilateral, left orbital, accompanied by tearing but not as much as last time.  No runny nose. She is not tearing now.  Denies any nausea, or visual changes. No phonophobia. Sometimes bright sunlight hurts her.  Says  Denies stress in her life. No weakness or numbness any where. It is not as bad as last time. No runny nose. No association with what she eats or menstrual cycle that she knows of. Has never kept a calendar of her headaches. Eats regular meals as well as breakfast. No aura. Come on 2 times a month.  Has 4 children at home she takes care of. Drinks a lot of tea caffinated during the day 3/day. No excessive daytime sleepiness or snoring.   Has never tried any medication for her headaches.  Has hx of allergies. Was on loratidine before and said this works. Does not think her allergies are contributing to her HAs.   Has hx of arrhythmia during her pregnancies. Last pregnancy was in 2007. Has not had any problems since this point. There is a note from her cardiologist indicating that she could try Verapamil for this. Took this in the past but does not take this now. Did not have any problems with the medication.  Came in March 4th for same kind of headache and was  given Imitrex 6 mg and oxygen and it went away eventually. No FH of HAs.     Habits & Providers     Tobacco Status: never  Past History:  Past Medical History:    cardiac arrythmia-nonsustained VT, GC/Chl neg 9-05, TSH WNL 9-05    -RV outflow tract/septal ventricular tachycardia - followed by [facility]    -tonsillar hypertrophy  s/p tonsillectomy 2008 (12/22/2007)  Social History:    Smoking Status:  never  Physical Exam  General:  alert, well-developed, and well-nourished.  overweight.  Head:  No tendeness to palpation over sinuses Eyes:  EOMI, PERRL Nose:  No enlarged turbinates. Mouth:  pharynx pink and moist, no erythema, and no exudates.   Neck:  supple, no masses, and no cervical lymphadenopathy.   Lungs:  normal respiratory effort, normal breath sounds, no crackles, and no wheezes.   Heart:  normal rate, regular rhythm, no murmur, no gallop, and no rub.   Abdomen:  soft and non-tender.   Msk:  5/5 strength bilateral upper and lower extremities. Extremities:  No edema  Neurologic:  alert & oriented X3, cranial nerves II-XII intact, strength normal in all extremities, and sensation intact to light touch.   Psych:  not anxious appearing and  not depressed appearing.     Impression & Recommendations:  Problem # 1:  HEADACHE (ICD-784.0) Assessment Deteriorated  Does not seem to really fit cluster characteristics today as patient says she is not having much tearing and it has now lasted since yesterday and is not coming and going. Could be migraine but only slight photophobia if any. Does not seem to be related to stress as patient says she has no stress in her life. Advised her to make calendar of HAs and any possible triggers to bring to next apt. Increase water intake. Try to decrease intake of Caffeine. Will try Verapamil 120 mg as a preventative medication. She was on this in the past for her arrhythmia and had no problems taking this. Will try Tyenol or Ibuprofen as an  abortive medication. Has not tried these before. Will give IM toradol 60 mg today. If in the future she needs a by mouth triptan will need approval by cards given her hx of arrhythmia.   Orders: [facility]- Est  Level 4 ([address]109)  Problem # 2:  ALLERGIC RHINITIS (ICD-477.9) Assessment: Unchanged  Try Loratadine in case allergies influencing HAs.   Her updated medication list for this problem includes:    [facility] Loratadine 10 Mg Tabs (Loratadine) .Marland Kitchen... Take one tablet daily for allergies  Orders: [facility]- Est  Level 4 (99214)  Complete Medication List: 1)  Ventolin Hfa 108 (90 Base) Mcg/act Aers (Albuterol sulfate) .... 2 puffs every 4 hours as needed for wheezing or shortness of breath disp: 2 2)  [facility] Loratadine 10 Mg Tabs (Loratadine) .... Take one tablet daily for allergies 3)  Verapamil Hcl Cr 120 Mg Xr24h-cap (Verapamil hcl) .... Take one tablet every evening for headache prevention  Patient Instructions: 1)  Make sure you are drinking a lot of water every day. 2)  Also I would try to decrease the amt of tea you are drinking. 3)  Keep a calendar of your headaches, periods, and anything you can think of that maybe contributing to your headaches.  4)  You may take Ibuprofen or Tylenol if you have headaches in the future. You can take up to 650 mg of Tylenol every 4 hours or 800 mg of Ibuprofen every 8 hours.  5)  I am starting Verapamil to try to prevent your headaches from occuring as frequently. 6)  Take Claritin for your allegies.  7)  Come back to see [place] in 1 month.  Prescriptions: VERAPAMIL HCL CR 120 MG XR24H-CAP (VERAPAMIL HCL) Take one tablet every evening for headache prevention  #30 x 1   Entered and Authorized by:   Cyndia Bent MD   Signed by:   Cyndia Bent MD on 06/24/2008   Method used:   Electronically to        [facility] (retail)       [address]       [place], [place]  [address]       Ph: [phone]       Fax: [phone]   RxID:    [id number] [facility] LORATADINE 10 MG TABS (LORATADINE) Take one tablet daily for allergies  #30 x 3   Entered and Authorized by:   Cyndia Bent MD   Signed by:   Cyndia Bent MD on 06/24/2008   Method used:   Electronically to        [facility] (retail)       [address]       [place], [place]  [address]       Ph: [phone]       Fax: [phone]   RxID:   [id number]   Appended Document: Toradol injection     Clinical Lists Changes  Orders: Added new Service order of Ketorolac-Toradol 15mg  [phone]) - Signed       Medication Administration  Injection # 1:    Medication: Ketorolac-Toradol 15mg     Diagnosis: HEADACHE (ICD-784.0)    Route: IM    Site: RUOQ gluteus    Exp Date: 10/11    Lot #: [id number]    Mfr: app pharmaceutical    Comments: pt given 60 mg of toradol    Patient tolerated injection without complications    Given by: Alphia Kava (June 24, 2008 9:44 AM)  Orders Added: 1)  Ketorolac-Toradol 15mg  [U4403]

## 2010-04-24 NOTE — Assessment & Plan Note (Signed)
Summary: MRSA exposure/[place]   Vital Signs:  Patient profile:   30 year old female Height:      62 inches Weight:      217 pounds BMI:     39.83 Temp:     98.5 degrees F oral Pulse rate:   96 / minute BP sitting:   138 / 97  (right arm) Cuff size:   regular  Vitals Entered By: Garen Grams LPN (March 10, 2009 3:41 PM) CC: MRSA questions Is Patient Diabetic? No Pain Assessment Patient in pain? no        Primary Care Kimberlin Scheel:  Myrtie Soman  MD  CC:  MRSA questions.  History of Present Illness: 72 yo mother of 23 yo healthy child who recently died with MRSA bacteremia here for follow-up questions/concerns about MRSA infection.  Mom has no further questions about her child after discussion with PCP Dr. Rexene Alberts yesterday.  Discussed that MRSA is very prevalent in community and that a large number of people are carriers but very few people get life-threatening infections.  Most common presentation being skin lesions.  Mom denies any current lesions or history of boils.  Inquires about need for blood testing and treatment.  Discussed that people with bacteremia are very ill and would not be asymptomatic.  All questions asnwered.  Habits & Providers  Alcohol-Tobacco-Diet     Tobacco Status: never  Review of Systems      See HPI General:  Denies fatigue, fever, malaise, and weight loss. Derm:  Denies lesion(s).  Physical Exam  General:  Well-developed,well-nourished,in no acute distress; alert,appropriate and cooperative throughout examination.  Here today with Mother and other son.  Seems to be doing well. Skin:  Patient has done full body search and no lesions identified.  Looked at one small area on abdomen with healing area of hypopigmentation.   Impression & Recommendations:  Problem # 1:  Family Hx of MRSA (ICD-041.12)  No evidence of current skin lesion and no history of past lesions.  Given situation, will not test for carrier state and will start MRSA  decontamination with bactroban three times a day x 1 week and clorhexidine wash.  Extensive discussion with mom and printed info given on MRSA.  She feels more reassured now and advised may resume normal skin care after this treatment.  Orders: [facility]- Est Level  3 (99213)  Complete Medication List: 1)  Ventolin Hfa 108 (90 Base) Mcg/act Aers (Albuterol sulfate) .... 2 puffs every 4 hours as needed for wheezing or shortness of breath disp: 2 2)  [facility] Loratadine 10 Mg Tabs (Loratadine) .... Take one tablet daily for allergies 3)  Verapamil Hcl Cr 120 Mg Xr24h-cap (Verapamil hcl) .... Take one tablet every evening for headache prevention 4)  Bactroban Nasal 2 % Oint (Mupirocin calcium) .... Apply to both nares three time a day for 7 days  Patient Instructions: 1)  MRSA decontamination 2)  For 7 days, wash entire body daily with clorhexidine soap (Hibiclens is brand name).  try a small area first to make you you arent allergic to it. 3)  Also use bactroban ointment in both nostrils three times daily x 7 days. Prescriptions: BACTROBAN NASAL 2 % OINT (MUPIROCIN CALCIUM) apply to both nares three time a day for 7 days  #1 x 0   Entered and Authorized by:   Delbert Harness MD   Signed by:   Delbert Harness MD on 03/10/2009   Method used:   Electronically to  [facility])       [address]       [place], [place]  [address]       Ph: [phone]       Fax: [phone]   RxID:   [id number]

## 2010-04-24 NOTE — Consult Note (Signed)
Summary: [facility]  [facility]   Imported By: Knox Royalty 09/09/2007 08:50:21  _____________________________________________________________________  External Attachment:    Type:   Image     Comment:   External Document

## 2010-04-24 NOTE — Progress Notes (Signed)
Summary: Referral   Phone Note Call from Patient Call back at Home Phone [phone]   Reason for Call: Referral Summary of Call: Is wanting to discuss a referral to a counselor. Initial call taken by: Haydee Salter,  October 09, 2007 9:55 AM  Follow-up for Phone Call        not sleeping well. crying daily. does not feel like going to work. argues with boyfriend all the time "wants to hurt herself" states she did not want to kill herself "I am not crazy" did not have a plan. states that no one cares. admits to a lot of stress. 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 gave her Dr. Carola Rhine number to call for an appt. she agreed to come in today to see a doctor. will be here at 3:30 with Dr. Lanier Prude. agreed not to do anything to herself.    Follow-up by: Golden Circle RN,  October 09, 2007 10:24 AM

## 2010-04-24 NOTE — Assessment & Plan Note (Signed)
Summary: referral/ent/el  Medications Added VERAPAMIL HCL CR 120 MG  CP24 (VERAPAMIL HCL) one tab daily        Vital Signs:  Patient Profile:   30 Years Old Female Height:     62 inches Weight:      217.3 pounds BMI:     39.89 Temp:     98.4 degrees F Pulse rate:   88 / minute BP sitting:   128 / 87  Pt. in pain?   no  Vitals Entered By: Golden Circle RN (December 26, 2006 2:42 PM)                  PCP:  Myrtie Soman  MD  Chief Complaint:  needs ent referral for tonsillectomy.  History of Present Illness: 30 yo female with h/o RV outflow tract/septal ventricular tachycardia (followed by [facility]) presents requesting referral to ENT for tonsillectomy. Pt seen in ER approximately 1 week ago for sore throat. Strep screen positive and pt placed on 10-days of penicillin. Doing better today with some mild sore throat and difficulty swallowing. No problems eating.  Pt reports that she has had frequent sore throats since about 2006 and was seen by an ENT and recommended for tonsillectomy, however, pt became pregnant and surgery was postponed.   Pt reports feeling that her neck is somewhat swollen since the onset of these symptoms. Denies any heat/cold intolerance. Denies depressive symptoms. Denies any recent weight loss but does report weight gain which she attributes to eating more. Pt denies any skin color changes, facial swelling, or changes in the way her eyes look. Was recently seen on 10/1 by [place] for increased palpitations. Placed on verapamil. Pt has not noticed any improvement in symptoms yet.    Past Medical History:    Reviewed history from 05/22/2006 and no changes required:       cardiac arrythmia-nonsustained VT, GC/Chl neg 9-05, TSH WNL 9-05       -RV outflow tract/septal ventricular tachycardia - followed by [facility]       -tonsillar hypertrophy  Past Surgical History:    Reviewed history from 05/22/2006 and no changes required:       2D echo-nml  LV function, mild MR - 09/23/2002, c section 2-05 - 12/16/2003, EKG - NSR, freq PVCs, Rightward axis - 02/01/2004, EKG 9-05 frequent PVCs - 12/16/2003, T&A - 07/31/2004   Family History:    Reviewed history from 05/22/2006 and no changes required:       father 58- healthy, mother 59- sickle cell dz, sister 39, sickle cell trait  Social History:    Reviewed history from 05/22/2006 and no changes required:       Lives w/ 2yo son.  Has a boyfriend.  Sexually active, using condoms.  Denies smoking or ETOH. No drug history. Plan B given.  Smokes Marijuana.    Review of Systems       denies heat/cold intolerance, depressive symptoms, recent weight loss, exopthalmos   Physical Exam  General:     Obese, pleasant, in no acute distress; alert,appropriate and cooperative throughout examination Head:     Normocephalic and atraumatic without obvious abnormalities. No apparent alopecia or balding. Eyes:     possible exopthalmos; EOMI Mouth:     OP pink moist; no erythema or exudates; tonsils difficult to visualize for extended periods of time 2/2 anatomy, however they do appear to be larger than average with a crowded posterior oropharynx Neck:     bilateral neck swelling  noted most prominent immediately bilateral to trachea and symmetric; no motion abnormality noted on swallowing; no obvious cervical, mandibular, auricular or occipital adenopathy.  Lungs:     CTAB; nl WOB; no w/r/r Heart:     RRR; no murmur gallop or rub Skin:     normal color, warmth, turgor; no discoloration ,lesions, rubor or coldness. Cervical Nodes:     No lymphadenopathy noted Axillary Nodes:     No palpable lymphadenopathy Psych:     Cognition and judgment appear intact. Alert and cooperative with normal attention span and concentration. No apparent delusions, illusions, hallucinations    Impression & Recommendations:  Problem # 1:  HYPERTROPHY, TONSILS (ICD-474.11) Will refer to ENT. Given questionable exopthalmos  and neck exam (but negative ROS) will obtain TSH. Normal in 2005. Tonsillar hypertrophy noted on exam. Will follow ENTs evaluation.  Orders: ENT Referral (ENT) TSH-[facility] [phone]) [facility]- Est Level  3 (09811)   Complete Medication List: 1)  Verapamil Hcl Cr 120 Mg Cp24 (Verapamil hcl) .... One tab daily   Patient Instructions: 1)  We will refer you to the ENT doctors to have them look at your throat. 2)  We will call you or send a letter if the thyroid test is abnormal. 3)  Come back in to be seen if you have any sensitivity to temperatures, if you have chest pain, your palpitations worsen, it becomes hard to eat or drink or if you have any other concerning symptoms. 4)  I'd like to see you back in about 3 months to see how everything is going. 5)  Nice to see you today.    ]  Vital Signs:  Patient Profile:   30 Years Old Female Height:     62 inches Weight:      217.3 pounds BMI:     39.89 Temp:     98.4 degrees F Pulse rate:   88 / minute BP sitting:   128 / 87

## 2010-04-24 NOTE — Assessment & Plan Note (Signed)
Summary: vaginitis  Medications Added METRONIDAZOLE 500 MG  TABS (METRONIDAZOLE) 1 tablet twice a day for 7 days.        Vital Signs:  Patient Profile:   30 Years Old Female Height:     62 inches Weight:      217.8 pounds Temp:     98.7 degrees F Pulse rate:   80 / minute BP sitting:   114 / 73  (left arm)  Pt. in pain?   no  Vitals Entered By: Jacki Cones RN (June 25, 2007 10:23 AM)                  PCP:  Myrtie Soman  MD  Chief Complaint:  vaginal discharge and intermittent lower abdominal pain.  History of Present Illness: Pt is here today for vaginal discharge.  Pt has had vaginal complaints including discharge, and foul odor.  Pt is currently sexually active 1 boyfriend.  Pt denies any fevers or chills.   no condoms LMP:06/01/07 Length of Menses:3 days Avg Bleeding:heavy Color of Discharge: white Abdominal Pain: no, mild cramps Pregnancy test: BTL Dysuria: no Red Flags: Missed period: no Pregnancy:no Recent antibiotics:no Sexual activity:yes Possible STD exposure:no Contraceptive method:BTL IUD:no Diabetes:no      Past Medical History:    cardiac arrythmia-nonsustained VT, GC/Chl neg 9-05, TSH WNL 9-05    -RV outflow tract/septal ventricular tachycardia - followed by [facility]    -tonsillar hypertrophy      Risk Factors:  Tobacco use:  quit    Year quit:  2004    Physical Exam  General:     Well-developed,well-nourished,in no acute distress; alert,appropriate and cooperative throughout examination Genitalia:     Pelvic Exam: whitish discharge from cervical os        External: normal female genitalia without lesions or masses        Vagina: normal without lesions or masses        Cervix: normal without lesions or masses        Adnexa: normal bimanual exam without masses or fullness        Uterus: normal by palpation        Pap smear: not performed    Impression & Recommendations:  Problem # 1:  VAGINAL DISCHARGE  (ICD-623.5) consistent with BV.  will treat with flagyl.   Orders: [facility]- Est Level  3 (99213) Wet Prep- [facility] ([address]109) GC/Chlamydia-[facility] (87591/87491)   Complete Medication List: 1)  Verapamil Hcl Cr 120 Mg Cp24 (Verapamil hcl) .... One tab daily 2)  Metronidazole 500 Mg Tabs (Metronidazole) .Marland Kitchen.. 1 tablet twice a day for 7 days.   Patient Instructions: 1)  call tomorrow for your other results.  2)  do not drink alcohol with this medication. 3)  followup with your pcp as previously scheduled.     Prescriptions: METRONIDAZOLE 500 MG  TABS (METRONIDAZOLE) 1 tablet twice a day for 7 days.  #14 x 0   Entered and Authorized by:   Wilhemina Bonito  MD   Signed by:   Wilhemina Bonito  MD on 06/25/2007   Method used:   Print then Give to Patient   RxID:   [id number]  ] Laboratory Results  Date/Time Received: June 25, 2007 10:35 AM  Date/Time Reported: June 25, 2007 10:45 AM   Wet [place] Source: Vaginal WBC/hpf: 5-10 Bacteria/hpf: 3+  Cocci Clue cells/hpf: moderate  Positive whiff Yeast/hpf: none Trichomonas/hpf: none Comments: ...................................................................DONNA [facility]  June 25, 2007 10:45 AM

## 2010-04-24 NOTE — Letter (Signed)
Summary: *Referral Letter  [facility]  [address]   [place], [place] [address]   Phone: [phone]  Fax: [phone]    12/26/2006  Thank you in advance for agreeing to see my patient.  [facility] [address] [place], [place]  [address]  Phone: [phone]  Reason for Referral: Tonsillar hypertrophy.  Procedures Requested: None   Current Medications: 1)  VERAPAMIL HCL CR 120 MG  CP24 (VERAPAMIL HCL) one tab daily   Past Medical History: 1)  cardiac arrythmia-nonsustained VT, GC/Chl neg 9-05, TSH WNL 9-05 2)  -RV outflow tract/septal ventricular tachycardia - followed by [place] 3)  -tonsillar hypertrophy   Pertinent Labs: TSH pending at this time.   Thank you again for agreeing to see our patient; please contact us if you have any further questions or need additional information.  Sincerely,       Myrtie Soman  MD

## 2010-04-24 NOTE — Progress Notes (Signed)
Summary: phn msg   Phone Note Call from Patient Call back at [phone]   Caller: Patient Summary of Call: Pt returning Dr. Danford Bad call from today. Initial call taken by: Clydell Hakim,  April 27, 2009 4:35 PM  Follow-up for Phone Call        Spoke with Ms. Westerlund. States that she's doing "okay." States that her sleep is better but that she's not taking the ativan. States that she just "prays and let's it go." States that she never had a meeting with Dr. Raymon Mutton because she doesn't really want to talk about it. States that her biggest question is "why" and she doesn't feel like that will be answered in a meeting with Dr. Raymon Mutton. I asked her to please let me know if there's anything she needs from me, or if she decides that she would like to meet with Dr. Raymon Mutton. She was grateful and expressed agreeement.  Pt also mentioned that she would like a copy of the visit in which I went over [name]'s case and what we knew about the circumstances of his death. I advised that we could do this. Follow-up by: Myrtie Soman  MD,  April 28, 2009 9:40 AM

## 2010-04-24 NOTE — Progress Notes (Signed)
Summary: returned call  Phone Note Call from Patient Call back at Home Phone [phone]   Reason for Call: Talk to Doctor Summary of Call: pt is returning Dr. Lianne Bushy call Initial call taken by: ERIN LEVAN,  Aug 01, 2006 3:53 PM  Follow-up for Phone Call        J Fleeger to return call.   Follow-up by: Crawford Givens MD,  Aug 01, 2006 5:02 PM    Appended Document: returned call attempted to call again but still no answer and no call back. LMOVM to pt "there are some rx for  you to pick up at your pharmacy, call if you have any questions"

## 2010-05-08 ENCOUNTER — Encounter: Payer: Self-pay | Admitting: *Deleted

## 2010-06-06 LAB — URINE MICROSCOPIC-ADD ON

## 2010-06-06 LAB — URINALYSIS, ROUTINE W REFLEX MICROSCOPIC
Bilirubin Urine: NEGATIVE
Glucose, UA: NEGATIVE mg/dL
Ketones, ur: NEGATIVE mg/dL
Nitrite: NEGATIVE
Protein, ur: NEGATIVE mg/dL
Specific Gravity, Urine: 1.02 (ref 1.005–1.030)
Urobilinogen, UA: 1 mg/dL (ref 0.0–1.0)
pH: 6 (ref 5.0–8.0)

## 2010-06-06 LAB — POCT PREGNANCY, URINE: Preg Test, Ur: NEGATIVE

## 2010-08-07 NOTE — Assessment & Plan Note (Signed)
[facility]                         ELECTROPHYSIOLOGY OFFICE NOTE   TILLEY, FAETH                       MRN:          [id number]  DATE:12/24/2006                            DOB:          [birth year]    Ms. Mull returns today for follow-up.  She is a very pleasant 30-year-  old woman who has a history of RV outflow tract/septal ventricular  tachycardia who returns today for follow-up.  The patient had her second  child back in March and this was without particular difficulty.  She did  well for many months but notes in the last month or so to have had  worsening palpitations.  She has had no syncope.  She denies shortness  of breath.  She has been otherwise stable.  Her baby was born and has  done well.  She returns today for follow-up.  She has been on no  medications.   PHYSICAL EXAMINATION:  She is a pleasant, well-appearing young woman in  no distress.  Blood pressure was 120/68, pulse was 93 and regular, respirations were  16, and the weight was 215 pounds.  NECK:  Revealed no jugular venous distention.  LUNGS:  Clear bilaterally to auscultation.  No wheezes, rales, or  rhonchi were present.  CARDIOVASCULAR:  Regular rate and rhythm with normal S1/S2.  EXTREMITIES:  Demonstrated no edema.  ABDOMEN:  Soft and nontender.   EKG demonstrates sinus rhythm with  nonspecific T-wave abnormality.   IMPRESSION:  RV outflow tract/septal ventricular tachycardia, now with  recurrent symptoms.   DISCUSSION:  Overall  Ms. Snader is stable, but she does have more  symptomatic VP and for this reason I have recommended that we start her  back on her low dose Verapamil.  Will give her a prescription today for  120 mg daily and if her palpitations are not controlled within 3-4  weeks, will up titrate her dose as required.  I will plan to see her  back in 3-4 months.     Doylene Canning. Ladona Ridgel, MD  Electronically Signed    GWT/MedQ  DD: 12/24/2006  DT:  12/24/2006  Job #: [id number]

## 2010-08-07 NOTE — Op Note (Signed)
NAMESAMANTA, GAL                ACCOUNT NO.:  [id number]   MEDICAL RECORD NO.:  [id number]          PATIENT TYPE:  AMB   LOCATION:  DSC                          FACILITY:  [facility]   PHYSICIAN:  Karol T. Lazarus Salines, M.D. DATE OF BIRTH:  1980/09/26   DATE OF PROCEDURE:  08/24/2007  DATE OF DISCHARGE:                               OPERATIVE REPORT   PREOPERATIVE DIAGNOSES:  Chronic tonsillitis.  Obstructive  adenotonsillar hypertrophy.   POSTOPERATIVE DIAGNOSES:  Chronic tonsillitis.  Obstructive  adenotonsillar hypertrophy.   PROCEDURE PERFORMED:  Tonsillectomy, adenoidectomy.   SURGEON:  Gloris Manchester. Wolicki, MD.   ANESTHESIA:  General orotracheal.   BLOOD LOSS:  Minimal.   COMPLICATIONS:  None.   FINDINGS:  The 3-4+ tonsils with copious cryptic debris on the left side  and moderate fibrosis left greater than right.  80% of obstructive  adenoids.  Moderately congested anterior nose.  Normal soft palate.   PROCEDURE:  With the patient in a comfortable supine position, general  orotracheal anesthesia was induced without difficulty.  At an  appropriate level, the table was turned 90 degree and the patient placed  in Trendelenburg.  A clean preparation and draping was accomplished.  Taking care to protect lips, teeth, and endotracheal tube, the CroweVernelle Emerald was introduced, expanded for visualization, and suspended  from the Mayo stand in the standard fashion.  The findings were as  described above.  The palate retractor and mirror were used to visualize  the nasopharynx with the findings as described above.  The anterior nose  was examined with nasal speculum with the findings as described above.  A 0.5% Xylocaine with 1:200,000 epinephrine, 10 mL total, was  infiltrated into the peritonsillar planes for intraoperative hemostasis.  Several minutes were allowed for this to take effect.   The adenoid pad was set free off the nasopharynx in a single pass  midline with a sharp  adenoid curette.  The tissue was carefully removed  from the field.  The nasopharynx was suctioned, cleaned, and packed with  saline-moistened tonsil sponges for hemostasis.   Beginning on the left side, the tonsil was grasped and retracted  medially.  The mucosa overlying the anterior and superior poles was  coagulated and then cut down to the capsule of the tonsil.  There was  moderate fibrosis.  There was copious cryptic debris, which was  evacuated.  Using the cautery tip with a blunt dissector, lysing fibrous  bands, and coagulating crossing vessels as identified, the tonsil was  dissected from its muscular fossa from superiorly downward.  The tonsil  was removed in its entirety as determined by examination of both tonsil  and fossa.  Additional quantity of cautery rendered the fossa  hemostatic.   After completing left tonsillectomy, the right side was done in  identical fashion.   After completing both tonsillectomies and rendering the oropharynx  hemostatic, the nasopharynx was unpacked.  A red rubber catheter was  passed through the nose and out the mouth just over the palate  retractor.  Using suction cautery and indirect visualization, small  adenoid  tags in the choanae and lateral bands were ablated, and finally  the adenoid bed proper was coagulated for hemostasis.  This was done in  several passes using irrigation to accurately localize the bleeding  sites.  Upon achieving hemostasis in the nasopharynx, the oropharynx was  also observed to be hemostatic.  At this point, the palate retractor and  mouthgag were relaxed for several minutes.  Upon re-expansion,  hemostasis was persistent.  At this point, the procedure was completed.  The palate retractor and mouthgag were relaxed and removed.  The dental  status was intact.  The patient was returned to anesthesia, awakened,  extubated, and transferred to recovery in stable condition.   COMMENT:  A 30 year old black female  with multiple recurrent episodes of  tonsillitis with indication for today's procedure.  Anticipate a routine  postoperative recovery with attention to analgesia, antibiosis,  hydration, and observation for bleeding, emesis, or airway compromise.      Gloris Manchester. Lazarus Salines, M.D.  Electronically Signed     KTW/MEDQ  D:  08/24/2007  T:  08/25/2007  Job:  [id number]

## 2010-08-10 NOTE — Op Note (Signed)
[facility]  Patient:    Molly Ford, Molly Ford Visit Number: [id number] MRN: [id number]          Service Type: OBS Location: 9300 [id number] Attending Physician:  Michaelle Copas Dictated by:   Georgina Peer, M.D. Proc. Date: 04/28/01 Admit Date:  04/27/2001   CC:         Conni Elliot, M.D.   Operative Report  PREOPERATIVE DIAGNOSIS:       Pregnancy, 40 weeks, failure to progress, meconium-stained fluid.  POSTOPERATIVE DIAGNOSIS:      Pregnancy, 40 weeks, failure to progress, meconium-stained fluid, intermittent self-limiting arrhythmia.  OPERATION:                    Low cervical transverse cesarean section.  SURGEON:                      Georgina Peer, M.D.  ANESTHESIA:                   Epidural, Dr. Arby Barrette.  ESTIMATED BLOOD LOSS:         500 cc.  FINDINGS:                     A 7 pound 11 ounce female at 0040 hours, Apgars 9 and 9.  No meconium below the cords after DeLee suctioning.  Normal tubes and ovaries.  COMPLICATIONS:                Self-limited runs of nonsustaining ventricular tachycardia.  INDICATIONS:                  This is an 30 year old African-American female, gravida 2, para 0-0-1-0, [facility] April 26, 2001, presented in labor.  Had ruptured membranes with thick meconium, was amnio-infused with thinning of the meconium but with adequate contractions failed to progress past 3 cm.  There was caput and molding and failure to progress was diagnosed.  The patient is Group B strep negative.  She was afebrile.  Fetal heart rate was reassuring. She was taken for cesarean section after informed consent.  She was aware of the risks and complications of the procedure including bowel, bladder, vascular injury, anesthetic or drug complications, increased risk of blood clots and postoperative recovery time.  DESCRIPTION OF PROCEDURE:     The patient was taken to the operating room. Epidural was dosed appropriately.  The  patient was prepped and draped sterilely.  Foley catheter had been placed in the bladder.  Allis test confirmed adequate anesthesia.  A transverse incision was made in the skin and taken to the peritoneal cavity with a normal Pfannenstiel manner.  Bleeders were cauterized.  A transverse bladder flap was created and a transverse uterine incision was made.  Thinner meconium-stained fluid came through the incision which was then widened with bandage scissors and at 0040 a female infant was delivered in the right occiput transverse presentation, rotated anterior and delivered with the aid of fundal pressure.  After the head was delivered, DeLee suctioning and bulb suctioning removed all secretions from the mouth and nose.  The infant was then fully delivered and handed to the pediatric team attending after cord was doubly clamped and cut.  Placenta and membranes were removed.  There was no extension to the incision.  The uterus was well contracted and the patient received Ancef 1 g IV.  The incision was closed in one running locked layer of  Vicryl with figure-of-eight sutures and mattress sutures of Vicryl to control bleeding along the edges.  Tubes and ovaries were inspected and found to be normal.  All bleeders were cauterized. A figure-of-eight suture was placed in the midline to secure the bladder flap and also figure-of-eight suture to secure the pyramidalis muscles.  The underside of the fascia and muscles was inspected and all bleeders cauterized. The fascia was closed with Vicryl suture.  Subcutaneous tissue was cauterized for the bleeders and skin closed with skin staples.  The patient was noted the have nonsustaining runs of ventricular tachycardia which does not affect vital signs and caused a fluttering sensation in the patients chest.  She said she had had these prior to the operation also.  She also demonstrated these in the recovery room.  Cardiac evaluation will be carried out as  an outpatient.  INDICATIONS:  DESCRIPTION OF PROCEDURE: Dictated by:   Georgina Peer, M.D. Attending Physician:  Michaelle Copas DD:  04/28/01 TD:  04/28/01 Job: [id number]

## 2010-08-10 NOTE — Op Note (Signed)
Molly Ford, Molly Ford                ACCOUNT NO.:  [id number]   MEDICAL RECORD NO.:  [id number]          PATIENT TYPE:  OBV   LOCATION:  9317                          [facility]ACILITY:  [facility]   PHYSICIAN:  Phil D. Okey Dupre, M.D.     DATE OF BIRTH:  1980/05/31   DATE OF PROCEDURE:  09/26/2004  DATE OF DISCHARGE:                                 OPERATIVE REPORT   PROCEDURE:  Incision and drainage of abscess.   PREOPERATIVE DIAGNOSIS:  Left labial abscess of genitalia.   POSTOPERATIVE DIAGNOSIS:  Left labial abscess of genitalia.   SURGEON:  Javier Glazier. Rose, M.D.   SPECIMENS:  Cultures for aerobic and anaerobic bacteria.   ESTIMATED BLOOD LOSS:  10 cc.   OPERATIVE FINDINGS:  With this patient in the dorsal lithotomy position,  after spinal anesthesia, the perineum was prepped and draped in the usual  sterile manner.  The left genital labia minoris was swollen to measure  approximately 3 cm in width x 7 cm in height, starting approximately 2 cm  from the lower end of the vaginal fourchette and extending up and including  the clitoris.  It was markedly fluctuant.   The procedure went as follows:  As above, a small scalpel blade was used to  enter the abscess cavity by making a 1 cm vertical incision inside the  hymenal ring, and then separating the layers underneath with a small  hemostat until the cavity was entered and drained.  Once this was done, the  area was packed with an Iodoform 1/2 inch gauze which we felt probably would  fall out by itself, but because the patient has no way of getting home alone  we have decided to keep her overnight and speed up the process of resolution  with sitz baths.  The patient will be kept on doxycycline which she was  started on two days ago because of the abscess.  Subsequently, a culture for  positive Chlamydia came back, so will continue on the same medication.   DIAGNOSIS:  Incision and drainage of left labial genital abscess.       PDR/MEDQ  D:   09/26/2004  T:  09/26/2004  Job:  [id number]

## 2010-08-10 NOTE — Op Note (Signed)
Molly Ford, Molly Ford                ACCOUNT NO.:  [id number]   MEDICAL RECORD NO.:  [id number]          PATIENT TYPE:  INP   LOCATION:  9373                          [facility]ACILITY:  [facility]   PHYSICIAN:  Tracy L. [name], M.D.DATE OF BIRTH:  1980/10/09   DATE OF PROCEDURE:  06/05/2005  DATE OF DISCHARGE:                                 OPERATIVE REPORT   PREOPERATIVE DIAGNOSIS:  1.  39 weeks intrauterine pregnancy.  2.  History of primary cesarean section for failure to progress.  3.  Failed trial of labor after cesarean section.  4.  Nonreassuring fetal heart tones.  5.  Desire for sterilization.  6.  Frequent PVCs and sinus tachycardia.   POSTOPERATIVE DIAGNOSIS:  1.  39 weeks intrauterine pregnancy.  2.  History of primary low transverse cesarean section.  3.  Failed trial of labor after cesarean.  4.  Nonreassuring fetal heart tones secondary to nuchal cord.  5.  Mouth presentation (left occiput posterior presentation).  6.  Frequent premature ventricular complexes with sinus tachycardia.   PROCEDURE:  Repeat low transverse cesarean section with vacuum-assisted  delivery, BTL.   SURGEON:  Molly Ford, M.D.   ASSISTANT:  Molly Ford. [name], M.D.   ESTIMATED BLOOD LOSS:  600 mL.   IV FLUIDS:  2500 mL of lactated Ringer's.   URINE OUTPUT:  150 mL of clear urine.   ANESTHESIA:  Epidural.   INDICATIONS:  The patient is a 30 year old gravida 3, para 1-0-1-1 at [redacted]  weeks gestational age via 6-week ultrasound, who presented with spontaneous  rupture of membranes and dilated to 2 cm. The patient has a history of a  primary low transverse cesarean section for failure to progress. Upon review  of her operative note from her first cesarean section baby was found to be  in the right occiput posterior presentation. There was also noted that the  patient was having nonsustained runs of ventricular tachycardia and they  were causing her to have a fluttering sensation in her chest. The  patient  progressed to approximately 5 cm and her fetal heart rate tracing became  nonreassuring. She was having episodes of bradycardia and Dr. Okey Ford felt that  the delivery would take another 3 to 4 hours and felt that was too long to  wait for delivery. The patient was counseled about the risks and benefits of  repeat low transverse cesarean section and bilateral tubal ligation. The  patient wanted to proceed.   Another issue during the labor was that the patient was found to have an  irregular rhythm and EKG was obtained and showed that she was in sinus  tachycardia with frequent consecutive premature ventricular complexes. The  patient has been followed as an outpatient by Dr. Ladona Ford, cardiologist and  its noted in her [place] that when she wore her Holter monitor, she was  found to have sinus tachycardia with premature atrial complexes and  premature ventricular complexes with narrow complex tachycardia. She had  been on Verapamil 180 milligrams extended release as an outpatient. The  patient was not having any signs and symptoms of  distress from her irregular  rhythm.   FINDINGS:  Female infant in the cephalic left occiput posterior presentation.  Apgars 09/09.  Cord pH 7.27. Normal uterus, tubes and ovaries.   PROCEDURE:  The patient was taken to the operating room where epidural  anesthesia was found to be adequate. She was then prepped and prepped in  normal sterile fashion in dorsal supine position with leftward tilt. A  Pfannenstiel skin incision was then made with scalpel and carried through to  the underlying layer of fascia. The fascia was nicked in midline and  incision extended laterally with the Mayo scissors. The inferior aspect of  the fascial incision was then grasped with Kocher clamps, elevated  underlying rectus muscles dissected off bluntly. Attention was then turned  to the superior aspect of the incision which in similar fashion, was  grasped, tented up with  Kocher clamps, the rectus muscles dissected off  bluntly. The rectus muscles were separated in midline. The peritoneum  identified, tented up, entered sharply with Metzenbaum scissors. Peritoneal  incision was extended superiorly and inferiorly. Good visualization of the  bladder.   Bladder blade was then inserted and the vesicouterine perineum identified,  grasped with pickups and entered sharply with the Metzenbaum scissors. The  incision was extended laterally and the bladder flap created digitally.   Bladder blade was then reinserted and lower uterine segment incised in  transverse fashion with a scalpel. The uterine incision was extended  laterally with bandage scissors. Bladder blade was removed and the infant's  head was delivered with vacuum assistance and the body was also delivered  atraumatically.   Nose and mouth were bulb suctioned and cord clamped and cut. The infant was  handed off to waiting neonatologist.   Placenta was removed manually. Uterus was cleared of all clots and debris.  Placenta was sent. The uterine incision was repaired with 0 Vicryl in a  running locked fashion. There was excellent hemostasis. Gutters were then  irrigated. Fascia was reapproximated with 0 Vicryl in running fashion. Skin  was closed with staples. The patient tolerated procedure well. Sponge, lap  and needle counts were correct x2. Ancef was given at cord clamp.   The patient tolerated the procedure well and was taken to the recovery room  in stable condition. There were no complications. The patient was found to  be continuing to have frequent PVCs that was still asymptomatic. A  cardiology consult was made.           ______________________________  Molly Ford [name], M.D.     TLW/MEDQ  D:  06/05/2005  T:  06/06/2005  Job:  [id number]

## 2010-08-10 NOTE — Discharge Summary (Signed)
Molly Ford, Molly Ford                ACCOUNT NO.:  [id number]   MEDICAL RECORD NO.:  [id number]          PATIENT TYPE:  INP   LOCATION:  9111                          FACILITY:  [facility]   PHYSICIAN:  Phil D. Okey Dupre, M.D.     DATE OF BIRTH:  [birth year]   DATE OF ADMISSION:  06/04/2005  DATE OF DISCHARGE:  06/08/2005                                 DISCHARGE SUMMARY   ADMITTING DIAGNOSES:  1.  Spontaneous ruptured membranes at term.  2.  Nonsustained ventricular tachycardia.   DISCHARGE DIAGNOSES:  1.  Status post repeat low transverse cesarean section.  2.  Nonsustained ventricular tachycardia.  3.  Hypokalemia.  4.  Stable female infant born at term.   DISCHARGE MEDICATIONS:  1.  Verapamil sustained-release 360 mg p.o. daily.  2.  Toprol XL 50 mg p.o. daily.  3.  Percocet 5/325 one tablet p.o. q.4h. p.r.n. pain, #20, refills none.  4.  Ibuprofen 600 mg one tablet p.o. q.6h. p.r.n. pain, #30, refills none.   FOLLOW-UP:  Patient is to have a follow-up appointment with me in six weeks  at the family practice center.  She has arranged a follow-up for the baby in  one week with me.  She is also to arrange follow-up with cardiology.  She is  aware of this.   PROCEDURES:  Repeat low transverse cesarean section that was vacuum-assisted  for nonreassuring fetal heart tracing.  See dictated operative note.   CONSULTS:  Dr. Ladona Ridgel with cardiology   ADMISSION HISTORY AND PHYSICAL:  Patient is a pleasant 30 year old G3, P1-0-  1-1 that presented at 39 weeks after a spontaneous rupture of membranes.  She was known to have nonsustained ventricular tachycardia for which she had  been treated with verapamil during the pregnancy.  She was admitted to the  labor deck for management.   HOSPITAL COURSE:  #1 - The patient made gradual cervical change after  admission to L&D.  However, by the time the patient was 6-7 cm dilated she  was having nonreassuring fetal heart tracing.  This was observed with  internal and external monitors.  After discussion with Dr. Okey Dupre and Dr.  [name] it was determined that the patient and the infant would not likely  tolerate continued labor so further attempts at a VBAC were abandoned and  the patient was taken to the operating room for repeat low transverse  cesarean section with bilateral tubal ligation.  The patient tolerated the  procedure well.   At approximately 6 a.m. a 7 pound female baby was born with Apgars of 9 and 9.  Cord pH was 7.27.  He was transferred to the nursery and remained stable  throughout the hospitalization.  Molly Ford was transferred to postanesthesia  care unit for the patient's postoperative cardiac issues.  See problem #2.  For the patient's remaining obstetrical issues during this hospitalization  she recovered well.  Her fundus remained firm.  Her lochia decreased.  She  was tolerating p.o. intake, passing flatus, and urinating without  difficulty.  On June 08, 2005 she was stable for discharge.   #  2 - NONSUSTAINED VENTRICULAR TACHYCARDIA:  The patient had a history of  nonsustained ventricular tachycardia during this pregnancy which she was  followed by cardiology.  She had multiple instances of ectopic beats during  labor, in the operating room, and during her postoperative course.  She was  monitored in ICU for approximately 48 hours after her cesarean section.  She  was treated with verapamil 360 mg p.o. daily along with Toprol XL 50 mg p.o.  daily.  During this time she had no symptoms of left-sided no right-sided  failure.  She was not short of breath and she had no chest pain after 48  hours so she was transferred to the floor where she remained stable from a  cardiac standpoint.  She is to follow up with cardiology as above.   #3 - HYPOKALEMIA:  The patient had one isolated episode of hypokalemia that  was repleted without complication.   #4 - BIRTH OF FEMALE INFANT:  At time of discharge baby is stable, taking  p.o.  well with good urine output and a normal newborn examination.   DISCHARGE LABORATORIES:  As discussed above.      Dwana Curd Para March, M.D.    ______________________________  Javier Glazier. Okey Dupre, M.D.    GSD/MEDQ  D:  06/08/2005  T:  06/10/2005  Job:  [id number]   cc:   Doylene Canning. Ladona Ridgel, M.D.  [address]  [address]  [place]  [place] [address]

## 2010-08-10 NOTE — Discharge Summary (Signed)
[facility]  Patient:    Molly Ford, Molly Ford Visit Number: [id number] MRN: [id number]          Service Type: OBS Location: [phone] Attending Physician:  Michaelle Copas Dictated by:   Vear Clock, M.D. Admit Date:  04/27/2001 Discharge Date: 04/30/2001                             Discharge Summary  DISCHARGE MEDICATIONS: 1. Ibuprofen 600 mg p.o. q.6-8h. p.r.n. pain. 2. Percocet 5/325 mg p.o. q.4-6h. p.r.n. severe pain. 3. Ortho Evra patch apply first patch on Sunday, 05/10/01, leave on for one    week, and then replace every week for three week, after three weeks take    patch off for one week, and then restart. 4. Prenatal vitamins one p.o. q.a.m. x6 weeks. 5. Fergon 325 mg p.o. q.h.s. x6 weeks.  FOLLOWUP:  [facility] in six weeks.  HISTORY OF PRESENT ILLNESS:  The patient is an 30 year old, G2, P0-0-1-0, and 40-1/[redacted] weeks gestation who presents with onset of labor.  The patient had been contracting all night.  Denied membrane leakage.  Positive vaginal bleeding. Positive fetal activity today, but decreased over the past week.  The patient received her care at [facility].  Pregnancy complicated by her adolescence.  MEDICATIONS:  None.  ALLERGIES:  No known drug allergies.  OBSTETRICAL HISTORY:  SAB x1, less than 20 weeks.  PAST MEDICAL HISTORY:  History of asthma.  PAST SURGICAL HISTORY:  History of D&E, status post SAB in 2000.  PRENATAL LABORATORY DATA:  O+, antibody negative, platelets 290, rubella immune.  HBSAG negative.  HIV negative.  GC negative.  Chlamydia negative. GBS negative.  PHYSICAL EXAMINATION:  PELVIC:  The patients cervix was 1.5 cm, 80%, vertex, and -1.  Fetal heart rate was reactive with average variability and a late deceleration x1.  The patient was contracting every 2 to 3 minutes, each lasting approximately 60 seconds.  ASSESSMENT:  Intrauterine pregnancy at 40+ weeks with  nonreassuring fetal heart rate and latent labor.  PLAN:  The patient was admitted for continuous external fetal monitoring, consideration of augmentation of latent labor secondary to fetal tracing if no improvement.  HOSPITAL COURSE:  The patient was started on low dose Pitocin as per protocol. Fetal scalp electrode and IUPC were placed, and the patient was anti-infused, and eventually diagnosed with failure to progress and meconium stained fluid, and was taken for a low transverse cesarean section under epidural anesthesia. Perioperative period included a self-limited brief run of ventricular tachycardia which was non-sustaining.  The patient delivered a viable female infant at 51 on 04/28/01, with Apgars of 9 and 9.  The patient was bottle feeding, and chose to use Ortho Evra patch.  The patients discharge hemoglobin was 9.5, and thus she was discharged with [facility] as well as prenatal vitamins.  The patient was discharged to home without further incident.  Staples were removed prior to discharge. Dictated by:   Vear Clock, M.D. Attending Physician:  Michaelle Copas DD:  05/13/01 TD:  05/14/01 Job: [id number]

## 2010-08-10 NOTE — Consult Note (Signed)
NAMEDONEEN, OLLINGER                ACCOUNT NO.:  [id number]   MEDICAL RECORD NO.:  [id number]          PATIENT TYPE:  INP   LOCATION:  9373                          FACILITY:  [facility]   PHYSICIAN:  Doylene Canning. Ladona Ridgel, M.D.  DATE OF BIRTH:  [birth year]   DATE OF CONSULTATION:  06/05/2005  DATE OF DISCHARGE:                                   CONSULTATION   REQUESTING PHYSICIAN:  Dr. Tamela Oddi   INDICATION FOR CONSULTATION:  Evaluation of nonsustained ventricular  tachycardia in a patient with known right ventricular outflow tract  ventricular tachycardia who has just undergone emergent cesarean section for  term gestation who experienced fetal distress.   HISTORY OF PRESENT ILLNESS:  The patient is a 30 year old woman with a  history of nonsustained ventricular tachycardia and known RV outflow tract  ventricular tachycardia in the past.  She has normal LV systolic function  and no RV dysfunction.  The patient's ventricular tachycardia has been  fairly well controlled until she became pregnant with her second pregnancy  for which she developed recurrent episodes of ventricular tachycardia which  was nonsustained, associated with palpitations and dizziness, but no frank  syncope.  The patient was under the care of a different physician from  myself and treated with verapamil during her pregnancy.  The patient's  symptoms improved but they were still somewhat present and she was admitted  to the hospital and underwent careful monitoring which ultimately resulted  in fetal deceleration and emergent cesarean section with a cephalic grade 2  placenta being described on ultrasound.  The patient's cesarean section was  without difficulty but postoperatively she developed frequent bouts of  nonsustained ventricular tachycardia, couplets, and triplets.  12-lead EKG  was obtained which demonstrates the above.  The patient's ventricular  tachycardia is an RV outflow tract ventricular tachycardia with  transition  from negative to positive precordial lead V3.  The inferior leads were  markedly upright and positive as would be expected.  She is being treated  now with increasing doses of verapamil, but continued to have occasional  bursts of nonsustained ventricular tachycardia which has been largely  asymptomatic.  She has had no frank syncope.  She denies dyspnea.   PAST MEDICAL HISTORY:  Notable for the ventricular tachycardia as previously  described.  Otherwise, her health has been good, perhaps with the exception  of gestational hypertension.   FAMILY HISTORY:  Noncontributory.   SOCIAL HISTORY:  The patient denies tobacco or ethanol abuse.   REVIEW OF SYSTEMS:  Negative except for being pregnant.   PHYSICAL EXAMINATION:  GENERAL:  Notable for a pleasant, well-appearing  young woman in no acute distress.  VITAL SIGNS:  Blood pressure 100/65, pulse 70 and irregular, respirations  18, temperature 99.  HEENT:  Normocephalic, atraumatic and atraumatic.  Pupils equal and round.  The oropharynx was moist.  Sclerae were anicteric.  NECK:  No jugular venous distention.  There is no thyromegaly.  There is an  occasional cannon A-wave.  The trachea was midline.  LUNGS:  Clear bilaterally to auscultation.  No wheezes, rales, or rhonchi.  CARDIOVASCULAR:  Irregular rhythm with normal S1 and S2.  The PMI was not  laterally displaced.  ABDOMEN:  Soft, nontender.  The bowel sounds are present.  There was a lower  abdominal incision from her cesarean section.  EXTREMITIES:  No clubbing, cyanosis, edema.  NEUROLOGIC:  Normal.   EKG demonstrates sinus rhythm with nonsustained ventricular tachycardia as  previously described.   IMPRESSION:  1.  Nonsustained ventricular tachycardia (right ventricular outflow tract).  2.  Status post term delivery by cesarean section.   DISCUSSION:  I would recommend that the dose of verapamil be decreased from  480 mg a day to 360 mg a day.  I recommend  using intravenous Lopressor as  needed to help suppress her ventricular tachycardia if it becomes more  symptomatic.  Would recommend continued observation in the ICU for at least  24 hours.           ______________________________  Doylene Canning. Ladona Ridgel, M.D.     GWT/MEDQ  D:  06/05/2005  T:  06/05/2005  Job:  [id number]

## 2010-10-17 ENCOUNTER — Inpatient Hospital Stay (INDEPENDENT_AMBULATORY_CARE_PROVIDER_SITE_OTHER)
Admission: RE | Admit: 2010-10-17 | Discharge: 2010-10-17 | Disposition: A | Discharge status: Home / Self Care | Payer: 59 | Source: Ambulatory Visit | Attending: Emergency Medicine | Admitting: Emergency Medicine

## 2010-10-17 ENCOUNTER — Ambulatory Visit (INDEPENDENT_AMBULATORY_CARE_PROVIDER_SITE_OTHER): Payer: 59

## 2010-10-17 DIAGNOSIS — M79609 Pain in unspecified limb: Secondary | ICD-10-CM

## 2010-12-20 LAB — POCT HEMOGLOBIN-HEMACUE: Hemoglobin: 13.8

## 2010-12-24 LAB — CBC
HCT: 39.7
Hemoglobin: 12.8
MCHC: 32.4
MCV: 82.6
Platelets: 257
RBC: 4.8
RDW: 14.4
WBC: 7.3

## 2010-12-24 LAB — URINALYSIS, ROUTINE W REFLEX MICROSCOPIC
Bilirubin Urine: NEGATIVE
Glucose, UA: NEGATIVE
Ketones, ur: NEGATIVE
Nitrite: NEGATIVE
Protein, ur: NEGATIVE
Specific Gravity, Urine: 1.022
Urobilinogen, UA: 0.2
pH: 6

## 2010-12-24 LAB — DIFFERENTIAL
Basophils Absolute: 0.1
Basophils Relative: 1
Eosinophils Absolute: 0.3
Eosinophils Relative: 5
Lymphocytes Relative: 31
Lymphs Abs: 2.3
Monocytes Absolute: 0.5
Monocytes Relative: 7
Neutro Abs: 4.1
Neutrophils Relative %: 56

## 2010-12-24 LAB — POCT I-STAT, CHEM 8
BUN: 9
Calcium, Ion: 1.14
Chloride: 108
Creatinine, Ser: 0.8
Glucose, Bld: 83
HCT: 42
Hemoglobin: 14.3
Potassium: 4.8
Sodium: 139
TCO2: 25

## 2010-12-24 LAB — URINE MICROSCOPIC-ADD ON

## 2010-12-24 LAB — POCT PREGNANCY, URINE: Preg Test, Ur: NEGATIVE

## 2011-01-03 LAB — RAPID STREP SCREEN (MED CTR MEBANE ONLY): Streptococcus, Group A Screen (Direct): POSITIVE — AB

## 2011-03-26 HISTORY — PX: BACK SURGERY: SHX140

## 2012-01-03 ENCOUNTER — Emergency Department (HOSPITAL_COMMUNITY)
Admission: EM | Admit: 2012-01-03 | Discharge: 2012-01-03 | Disposition: A | Discharge status: Home / Self Care | Payer: 59 | Source: Home / Self Care

## 2012-01-03 ENCOUNTER — Encounter (HOSPITAL_COMMUNITY): Payer: Self-pay

## 2012-01-03 DIAGNOSIS — J40 Bronchitis, not specified as acute or chronic: Secondary | ICD-10-CM

## 2012-01-03 HISTORY — DX: Unspecified asthma, uncomplicated: J45.909

## 2012-01-03 MED ORDER — ALBUTEROL SULFATE HFA 108 (90 BASE) MCG/ACT IN AERS: 1.0000 | INHALATION_SPRAY | Freq: Four times a day (QID) | RESPIRATORY_TRACT | Status: DC | PRN

## 2012-01-03 MED ORDER — AMOXICILLIN 500 MG PO CAPS: 500.0000 mg | ORAL_CAPSULE | Freq: Three times a day (TID) | ORAL | Status: DC

## 2012-01-03 NOTE — ED Provider Notes (Signed)
History     CSN: [id number]  Arrival date & time 01/03/12  0932   None     Chief Complaint  Patient presents with  . URI    (Consider location/radiation/quality/duration/timing/severity/associated sxs/prior treatment) Patient is a 31 y.o. female presenting with URI. The history is provided by the patient. No language interpreter was used.  URI The primary symptoms include cough. Episode onset: 2 weeks. This is a new problem.  Symptoms associated with the illness include congestion and rhinorrhea. The illness is not associated with chills.  Pt complains of congestion and cough  Past Medical History  Diagnosis Date  . Asthma     History reviewed. No pertinent past surgical history.  History reviewed. No pertinent family history.  History  Substance Use Topics  . Smoking status: Not on file  . Smokeless tobacco: Not on file  . Alcohol Use:     OB History    Grav Para Term Preterm Abortions TAB SAB Ect Mult Living                  Review of Systems  Constitutional: Negative for chills.  HENT: Positive for congestion and rhinorrhea.   Respiratory: Positive for cough.   All other systems reviewed and are negative.    Allergies  Review of patient's allergies indicates no known allergies.  Home Medications   Current Outpatient Rx  Name Route Sig Dispense Refill  . ALBUTEROL SULFATE HFA 108 (90 BASE) MCG/ACT IN AERS Inhalation Inhale 2 puffs into the lungs every 4 (four) hours as needed. for wheezing or shortness of breath     . LORATADINE 10 MG PO TABS Oral Take 10 mg by mouth daily. for allergies    . VERAPAMIL HCL 120 MG PO TBCR Oral Take 120 mg by mouth every evening. for headache prevention       BP 124/84  Pulse 78  Temp 98.5 F (36.9 C) (Oral)  Resp 20  SpO2 100%  Physical Exam  Nursing note and vitals reviewed. Constitutional: She appears well-developed and well-nourished.  HENT:  Head: Normocephalic and atraumatic.  Right Ear: External ear  normal.  Left Ear: External ear normal.  Nose: Nose normal.  Mouth/Throat: Oropharynx is clear and moist.  Eyes: Conjunctivae normal are normal. Pupils are equal, round, and reactive to light.  Neck: Normal range of motion. Neck supple.  Cardiovascular: Normal rate and regular rhythm.   Pulmonary/Chest: Effort normal.  Abdominal: Soft.  Musculoskeletal: Normal range of motion.  Neurological: She is alert.  Skin: Skin is warm.  Psychiatric: She has a normal mood and affect.    ED Course  Procedures (including critical care time)  Labs Reviewed - No data to display No results found.   No diagnosis found.    MDM  Rx for amoxicillian and albuterol inhaler        Lonia Skinner [place], [place] 01/03/12 1105

## 2012-01-03 NOTE — ED Notes (Signed)
Patient c/o she has had a cough w congestion for past 2-3 week; NAD

## 2012-01-04 NOTE — ED Provider Notes (Signed)
Medical screening examination/treatment/procedure(s) were performed by resident physician or non-physician practitioner and as supervising physician I was immediately available for consultation/collaboration.   Barkley Bruns MD.    Linna Hoff, MD 01/04/12 1736

## 2012-03-25 DIAGNOSIS — M1990 Unspecified osteoarthritis, unspecified site: Secondary | ICD-10-CM

## 2012-03-25 HISTORY — DX: Unspecified osteoarthritis, unspecified site: M19.90

## 2013-08-13 ENCOUNTER — Emergency Department (HOSPITAL_COMMUNITY): Payer: BC Managed Care – PPO

## 2013-08-13 ENCOUNTER — Emergency Department (HOSPITAL_COMMUNITY)
Admission: EM | Admit: 2013-08-13 | Discharge: 2013-08-13 | Disposition: A | Discharge status: Home / Self Care | Payer: BC Managed Care – PPO | Attending: Emergency Medicine | Admitting: Emergency Medicine

## 2013-08-13 ENCOUNTER — Encounter (HOSPITAL_COMMUNITY): Payer: Self-pay | Admitting: Emergency Medicine

## 2013-08-13 DIAGNOSIS — IMO0002 Reserved for concepts with insufficient information to code with codable children: Secondary | ICD-10-CM | POA: Insufficient documentation

## 2013-08-13 DIAGNOSIS — X58XXXA Exposure to other specified factors, initial encounter: Secondary | ICD-10-CM | POA: Insufficient documentation

## 2013-08-13 DIAGNOSIS — Y939 Activity, unspecified: Secondary | ICD-10-CM | POA: Insufficient documentation

## 2013-08-13 DIAGNOSIS — Z792 Long term (current) use of antibiotics: Secondary | ICD-10-CM | POA: Insufficient documentation

## 2013-08-13 DIAGNOSIS — Y929 Unspecified place or not applicable: Secondary | ICD-10-CM | POA: Insufficient documentation

## 2013-08-13 DIAGNOSIS — T148XXA Other injury of unspecified body region, initial encounter: Secondary | ICD-10-CM

## 2013-08-13 DIAGNOSIS — J45909 Unspecified asthma, uncomplicated: Secondary | ICD-10-CM | POA: Insufficient documentation

## 2013-08-13 DIAGNOSIS — Z79899 Other long term (current) drug therapy: Secondary | ICD-10-CM | POA: Insufficient documentation

## 2013-08-13 MED ORDER — HYDROCODONE-ACETAMINOPHEN 5-325 MG PO TABS: 1.0000 | ORAL_TABLET | Freq: Four times a day (QID) | ORAL | Status: DC | PRN

## 2013-08-13 NOTE — ED Notes (Signed)
Pt complains of R lower back/hip/thigh pain for the last month and a half that has increased in severity gradually. Pt states pain worse with movement and bending hip. Pt ambulated to room by self. Pt states pain worse with weight bearing.

## 2013-08-13 NOTE — ED Provider Notes (Signed)
CSN: [id number]     Arrival date & time 08/13/13  1602 History   First MD Initiated Contact with Patient 08/13/13 1609     Chief Complaint  Patient presents with  . Back Pain  . Leg Pain   Patient is a 33 y.o. female presenting with back pain and leg pain.  Back Pain Associated symptoms: leg pain, numbness and weakness   Leg Pain Associated symptoms: back pain    This chart was scribed for nurse practitioner, Teressa LowerVrinda Esperanza Madrazo working with Toy BakerAnthony T Allen, MD, by Andrew Auaven Small, ED Scribe. This patient was seen in room WTR5/WTR5 and the patient's care was started at 4:12 PM.  Molly Ford is a 33 y.o. female who presents to the Emergency Department complaining of gradually worsening right upper leg pain that radiates to lower back onset 1 month. Pt denies known injuries. Pt described pain as tight and tingling. Pt reports pain with bearing weight and certain movements such as walking up steps. Pt has taken ibuprofen without relief to pain. 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    Past Medical History  Diagnosis Date  . Asthma    No past surgical history on file. No family history on file. History  Substance Use Topics  . Smoking status: Not on file  . Smokeless tobacco: Not on file  . Alcohol Use:    OB History   Grav Para Term Preterm Abortions TAB SAB Ect Mult Living                 Review of Systems  Musculoskeletal: Positive for back pain and myalgias.  Neurological: Positive for weakness and numbness.   Allergies  Review of patient's allergies indicates no known allergies.  Home Medications   Prior to Admission medications   Medication Sig Start Date End Date Taking? Authorizing Provider  albuterol (PROVENTIL HFA;VENTOLIN HFA) 108 (90 BASE) MCG/ACT inhaler Inhale 1-2 puffs into the lungs every 6 (six) hours as needed for wheezing. 01/03/12   Elson AreasLeslie K Sofia, PA-C  albuterol (VENTOLIN HFA) 108 (90 BASE) MCG/ACT inhaler Inhale 2 puffs into the  lungs every 4 (four) hours as needed. for wheezing or shortness of breath     Historical Provider, MD  amoxicillin (AMOXIL) 500 MG capsule Take 1 capsule (500 mg total) by mouth 3 (three) times daily. 01/03/12   Elson AreasLeslie K Sofia, PA-C  loratadine ([facility] LORATADINE) 10 MG tablet Take 10 mg by mouth daily. for allergies    Historical Provider, MD  verapamil (CALAN-SR) 120 MG CR tablet Take 120 mg by mouth every evening. for headache prevention     Historical Provider, MD   There were no vitals taken for this visit. Physical Exam  Nursing note and vitals reviewed. Constitutional: She is oriented to person, place, and time. She appears well-developed and well-nourished. No distress.  HENT:  Head: Normocephalic and atraumatic.  Eyes: EOM are normal.  Neck: Neck supple.  Cardiovascular: Normal rate.   Pulmonary/Chest: Effort normal.  Musculoskeletal: Normal range of motion.       Right upper leg: She exhibits no swelling, no edema, no deformity and no laceration.  TTP to right upper leg Full ROM. Non tender in hip. No redness, swelling or warmth noted. Tender in the lower right back  Neurological: She is alert and oriented to person, place, and time. She exhibits normal muscle tone. Coordination normal.  Skin: Skin is warm and dry.  Psychiatric: She has a normal  mood and affect. Her behavior is normal.   ED Course  Procedures (including critical care time) Labs Review Labs Reviewed - No data to display  Imaging Review Dg Hip Complete Right  08/13/2013   CLINICAL DATA:  Progressive lower back and right upper leg pain without history of injury.  EXAM: RIGHT HIP - COMPLETE 2+ VIEW  COMPARISON:  DG KNEE 1-2 VIEWS*R* dated 10/17/2010; DG ANKLE COMPLETE*R* dated 02/09/2010  FINDINGS: The bony pelvis is adequately mineralized and intact. The hip joint spaces are preserved. AP and lateral views of the right hip reveal no suspicious findings. There are clips present in the pelvis from previous tubal  ligation. The lower lumbar spine is normal.  IMPRESSION: No acute or significant chronic bony abnormality of the pelvis or right hip.   Electronically Signed   By: David  [place]   On: 08/13/2013 16:40     EKG Interpretation None      MDM   Final diagnoses:  Muscle strain   Will send home with pain medication and ortho follow up. Likely strain. neurologically intact  I personally performed the services described in this documentation, which was scribed in my presence. The recorded information has been reviewed and is accurate.      Teressa Lower, NP 08/13/13 1652

## 2013-08-13 NOTE — Discharge Instructions (Signed)

## 2013-08-13 NOTE — ED Provider Notes (Signed)
Medical screening examination/treatment/procedure(s) were performed by non-physician practitioner and as supervising physician I was immediately available for consultation/collaboration.   Bode Pieper T Kynnadi Dicenso, MD 08/13/13 2234 

## 2013-09-22 ENCOUNTER — Encounter (HOSPITAL_COMMUNITY): Payer: Self-pay | Admitting: Emergency Medicine

## 2013-09-22 ENCOUNTER — Emergency Department (HOSPITAL_COMMUNITY): Payer: BC Managed Care – PPO

## 2013-09-22 ENCOUNTER — Emergency Department (HOSPITAL_COMMUNITY)
Admission: EM | Admit: 2013-09-22 | Discharge: 2013-09-22 | Disposition: A | Discharge status: Home / Self Care | Payer: BC Managed Care – PPO | Attending: Emergency Medicine | Admitting: Emergency Medicine

## 2013-09-22 DIAGNOSIS — E669 Obesity, unspecified: Secondary | ICD-10-CM | POA: Insufficient documentation

## 2013-09-22 DIAGNOSIS — Z791 Long term (current) use of non-steroidal anti-inflammatories (NSAID): Secondary | ICD-10-CM | POA: Insufficient documentation

## 2013-09-22 DIAGNOSIS — M79609 Pain in unspecified limb: Secondary | ICD-10-CM | POA: Insufficient documentation

## 2013-09-22 DIAGNOSIS — J45909 Unspecified asthma, uncomplicated: Secondary | ICD-10-CM | POA: Insufficient documentation

## 2013-09-22 DIAGNOSIS — M6281 Muscle weakness (generalized): Secondary | ICD-10-CM | POA: Insufficient documentation

## 2013-09-22 DIAGNOSIS — M79604 Pain in right leg: Secondary | ICD-10-CM

## 2013-09-22 MED ORDER — METHOCARBAMOL 500 MG PO TABS: 500.0000 mg | ORAL_TABLET | Freq: Two times a day (BID) | ORAL | Status: DC

## 2013-09-22 MED ORDER — DEXAMETHASONE SODIUM PHOSPHATE 10 MG/ML IJ SOLN
10.0000 mg | Freq: Once | INTRAMUSCULAR | Status: DC
  Filled 2013-09-22: qty 1

## 2013-09-22 MED ORDER — PREDNISONE 10 MG PO TABS: 20.0000 mg | ORAL_TABLET | Freq: Every day | ORAL | Status: DC

## 2013-09-22 MED ORDER — DEXAMETHASONE SODIUM PHOSPHATE 10 MG/ML IJ SOLN
10.0000 mg | Freq: Once | INTRAMUSCULAR | Status: DC
  Administered 2013-09-22: 10 mg via INTRAMUSCULAR

## 2013-09-22 MED ORDER — PREDNISONE 20 MG PO TABS
60.0000 mg | ORAL_TABLET | Freq: Once | ORAL | Status: AC
  Administered 2013-09-22: 60 mg via ORAL
  Filled 2013-09-22: qty 3

## 2013-09-22 NOTE — Progress Notes (Signed)
  CARE MANAGEMENT ED NOTE 09/22/2013  Patient:  Clyde CanterburyRUDD,Zonnie D   Account Number:  [id number]  Date Initiated:  09/22/2013  Documentation initiated by:  Edd ArbourGIBBS,Herberth Deharo  Subjective/Objective Assessment:   33 yr [place] no [place]cp listed c/o RLE weakness     Subjective/Objective Assessment Detail:   Pt reports Deductible of $2000-3000 & co pay of $200 requested that she does not have prior to being able to see an orthopedic provider for services  Discussed with pt that [facility] does not have a program to assist with deductible and co pays at this time     Action/Plan:   [facility] ED CM consulted by ED PA/NP when pt inquired about need to see orthopedic provider and issues with coverage CM reviewed EPIC notes Spoke with pt Cm discussed with pt that her [employer] coverage co pay and deductible determined by the plan she   Action/Plan Detail:   chose Discussed options of getting assistance from local churches & other financial assistance programs with funds towards deductible & co pay, speaking with [employer] or with ortho billing office on arrangements ED PA/NP updated   Anticipated DC Date:  09/22/2013     Status Recommendation to Physician:   Result of Recommendation:    Other ED Services  Consult Working Plan    DC Planning Services  Other  PCP issues  Outpatient Services - Pt will follow up    Choice offered to / List presented to:            Status of service:  Completed, signed off  ED Comments:   ED Comments Detail:  Discussed how to obtain in network providers for pcp and ortho using customer services # or [employer] web site. Discussed verifying if provider is in network ED CM reviewed pt bcbs coverage listed in EPIC pt station section to confirm for in network and out of network providers pt still will have to meet her almost $3000 deductible prior to receiving 100% coverage on many services.  Answered question for pt about how insurance plans provide  coverage. Encourage a change of coverage to provided lower deductible in future Pt very appreciative of information discussed and questions answered Given lists of local churches within her zip code of [phone] Pt states she will contact bcbs and the ortho provider office for assistance.

## 2013-09-22 NOTE — Discharge Instructions (Signed)

## 2013-09-22 NOTE — ED Provider Notes (Signed)
Medical screening examination/treatment/procedure(s) were performed by non-physician practitioner and as supervising physician I was immediately available for consultation/collaboration.   EKG Interpretation None        Richardean Canalavid H Yao, MD 09/22/13 1206

## 2013-09-22 NOTE — ED Notes (Signed)
Per pt, having generalized body aches for 2 months.  Pt also c/o pain and numbness to rt lower leg.  Pt cannot bend over to get shoes on.  Pt states she has been told to see another specialist but she cannot afford a visit.

## 2013-09-22 NOTE — ED Provider Notes (Signed)
CSN: [id number]     Arrival date & time 09/22/13  1010 History   First MD Initiated Contact with Patient 09/22/13 1019     Chief Complaint  Patient presents with  . Generalized Body Aches  . Leg Pain     (Consider location/radiation/quality/duration/timing/severity/associated sxs/prior Treatment) HPI  Patient to the ER with complaints of right lower extremity weakness that has been persistent for 2 months now. She was seen on 08/13/2013 for the same and reports it was the whole leg at that time but now reports it is from the knee down on the right side. She also reports having paresthesias to the leg. She was instructed to follow-up with an orthopedic doctor but says she can not afford it. She also endorses  Low back pain. She is obese and stands most of the day for her job. Denies leg giving out, IV drug use, fevers, or any injury. Denies left sided weakness.  Past Medical History  Diagnosis Date  . Asthma    Past Surgical History  Procedure Laterality Date  . Cesarean section     History reviewed. No pertinent family history. History  Substance Use Topics  . Smoking status: Never Smoker   . Smokeless tobacco: Not on file  . Alcohol Use: Yes     Comment: social   OB History   Grav Para Term Preterm Abortions TAB SAB Ect Mult Living                 Review of Systems   Review of Systems  Gen: no weight loss, fevers, chills, night sweats  Eyes: no discharge or drainage, no occular pain or visual changes  Nose: no epistaxis or rhinorrhea  Mouth: no dental pain, no sore throat  Neck: no neck pain  Lungs:No wheezing, coughing or hemoptysis CV: no chest pain, palpitations, dependent edema or orthopnea  Abd: no abdominal pain, nausea, vomiting, diarrhea GU: no dysuria or gross hematuria  MSK:  + muscle weakness or pain to right leg Neuro: no headache, no focal neurologic deficits  Skin: no rash or wounds Psyche: no complaints    Allergies  Review of patient's allergies  indicates no known allergies.  Home Medications   Prior to Admission medications   Medication Sig Start Date End Date Taking? Authorizing Provider  Homeopathic Products ([employer] LEG CRAMPS PAIN RELIEF PO) Take 1 tablet by mouth as needed (for leg cramps).   Yes Historical Provider, MD  ibuprofen (ADVIL,MOTRIN) 200 MG tablet Take 600 mg by mouth every 6 (six) hours as needed for moderate pain.   Yes Historical Provider, MD  naproxen sodium (ANAPROX) 220 MG tablet Take 660 mg by mouth 2 (two) times daily with a meal.   Yes Historical Provider, MD  methocarbamol (ROBAXIN) 500 MG tablet Take 1 tablet (500 mg total) by mouth 2 (two) times daily. 09/22/13   Shylo Dillenbeck Irine SealG Lacreasha Hinds, PA-C  predniSONE (DELTASONE) 10 MG tablet Take 2 tablets (20 mg total) by mouth daily. 09/22/13   Melenda Bielak Irine SealG Cybele Maule, PA-C   BP 141/74  Pulse 89  Temp(Src) 98.4 F (36.9 C) (Oral)  Resp 20  SpO2 100%  LMP 09/07/2013 Physical Exam  Nursing note and vitals reviewed. Constitutional: She appears well-developed and well-nourished. No distress.  HENT:  Head: Normocephalic and atraumatic.  Eyes: Pupils are equal, round, and reactive to light.  Neck: Normal range of motion. Neck supple.  Cardiovascular: Normal rate and regular rhythm.   Pulmonary/Chest: Effort normal.  Abdominal: Soft.  Musculoskeletal:  Right hip: She exhibits normal range of motion and normal strength.       Right knee: She exhibits normal range of motion and no swelling.       Right ankle: She exhibits normal range of motion and no swelling.  Pt has equal strength to bilateral lower extremities.  Neurosensory function adequate to both legs No clonus on dorsiflextion Skin color is normal. Skin is warm and moist.  I see no step off deformity, no midline bony tenderness.  Pt is able to ambulate.  No crepitus, laceration, effusion, induration, lesions, swelling.   Pedal pulses are symmetrical and palpable bilaterally  midline tenderness to palpation of  lumbar 4 region.  Neurological: She is alert. She has normal strength.  Pt has sensation to sharp and dull of the right foot. Physiologic strength to the right leg. Skin is normal, moist and warm.   Skin: Skin is warm and dry.    ED Course  Procedures (including critical care time) Labs Review Labs Reviewed - No data to display  Imaging Review Dg Lumbar Spine Complete  09/22/2013   CLINICAL DATA:  Low back pain with radicular symptoms  EXAM: LUMBAR SPINE - COMPLETE 4+ VIEW  COMPARISON:  None.  FINDINGS: Frontal, lateral, spot lumbosacral lateral, and bilateral oblique views were obtained. There are 5 non-rib-bearing lumbar type vertebral bodies. There is no fracture or spondylolisthesis. Disc spaces appear intact. There is no appreciable facet arthropathy.  IMPRESSION: No fracture or appreciable arthropathy.   Electronically Signed   By: Bretta BangWilliam  Woodruff M.D.   On: 09/22/2013 11:04     EKG Interpretation None      MDM   Final diagnoses:  Right leg pain    Patients symptoms have changed since her last visit but they have also improved. No appreciable weakness to the right leg, no wounds appreciated. Her symptoms seem most consistent with nerve vs musculo, she says she does not have insurance and can not see a provider.  I asked Kim with Case Management to see her and help set her up with follow-up. Prednisone given in ED. Will give a prednisone dose pack and muscle relaxers for home.  32 y.o.Molly Ford's evaluation in the Emergency Department is complete. It has been determined that no acute conditions requiring further emergency intervention are present at this time. The patient/guardian have been advised of the diagnosis and plan. We have discussed signs and symptoms that warrant return to the ED, such as changes or worsening in symptoms.  Vital signs are stable at discharge. Filed Vitals:   09/22/13 1020  BP: 141/74  Pulse: 89  Temp: 98.4 F (36.9 C)  Resp: 20     Patient/guardian has voiced understanding and agreed to follow-up with the PCP or specialist.     Dorthula Matasiffany G Chesni Vos, PA-C 09/22/13 1156

## 2013-10-07 ENCOUNTER — Encounter (HOSPITAL_COMMUNITY): Payer: Self-pay | Admitting: Emergency Medicine

## 2013-10-07 ENCOUNTER — Emergency Department (HOSPITAL_COMMUNITY)
Admission: EM | Admit: 2013-10-07 | Discharge: 2013-10-07 | Disposition: A | Discharge status: Home / Self Care | Payer: BC Managed Care – PPO | Attending: Emergency Medicine | Admitting: Emergency Medicine

## 2013-10-07 DIAGNOSIS — M25569 Pain in unspecified knee: Secondary | ICD-10-CM | POA: Insufficient documentation

## 2013-10-07 DIAGNOSIS — Z791 Long term (current) use of non-steroidal anti-inflammatories (NSAID): Secondary | ICD-10-CM | POA: Insufficient documentation

## 2013-10-07 DIAGNOSIS — M25559 Pain in unspecified hip: Secondary | ICD-10-CM | POA: Insufficient documentation

## 2013-10-07 DIAGNOSIS — M5431 Sciatica, right side: Secondary | ICD-10-CM

## 2013-10-07 DIAGNOSIS — Z79899 Other long term (current) drug therapy: Secondary | ICD-10-CM | POA: Insufficient documentation

## 2013-10-07 DIAGNOSIS — M5430 Sciatica, unspecified side: Secondary | ICD-10-CM | POA: Insufficient documentation

## 2013-10-07 DIAGNOSIS — J45909 Unspecified asthma, uncomplicated: Secondary | ICD-10-CM | POA: Insufficient documentation

## 2013-10-07 MED ORDER — KETOROLAC TROMETHAMINE 30 MG/ML IJ SOLN
30.0000 mg | Freq: Once | INTRAMUSCULAR | Status: AC
  Administered 2013-10-07: 30 mg via INTRAMUSCULAR
  Filled 2013-10-07: qty 1

## 2013-10-07 MED ORDER — DIAZEPAM 5 MG PO TABS: 5.0000 mg | ORAL_TABLET | Freq: Two times a day (BID) | ORAL | Status: DC

## 2013-10-07 MED ORDER — DIAZEPAM 5 MG/ML IJ SOLN
5.0000 mg | Freq: Once | INTRAMUSCULAR | Status: AC
  Administered 2013-10-07: 5 mg via INTRAMUSCULAR
  Filled 2013-10-07: qty 2

## 2013-10-07 NOTE — ED Provider Notes (Signed)
CSN: [id number]     Arrival date & time 10/07/13  2202 History  This chart was scribed for non-physician practitioner, Roxy Horsemanobert Marcellas Marchant, PA-C, working with Toy BakerAnthony T Allen, MD, by Bronson CurbJacqueline Melvin, ED Scribe. This patient was seen in room WTR9/WTR9 and the patient's care was started at 10:52 PM.    Chief Complaint  Patient presents with  . Back Pain  . Hip Pain  . Knee Pain    The history is provided by the patient. No language interpreter was used.    HPI Comments: Molly Ford is a 33 y.o. female who presents to the Emergency Department with a chief complaint of constant, lower back pain. She rates her pain as severe and states it radiates from her back down to her right hip and leg. She was seen here on July 4th for the same and was given pain medications and muscle relaxants, and advised to follow-up with orthopedics. However, patient reports the medications make her too tired to work, and states she is unable to afford to follow-up with orthopedics. Patient states she is unable to ambulate or bend down without feeling pain. She also reports pain when coughing. She reports she has taken Advil and Motrin with no relief. She denies bowel/bladder incontinence. Patient has history of asthma.   Past Medical History  Diagnosis Date  . Asthma    Past Surgical History  Procedure Laterality Date  . Cesarean section     History reviewed. No pertinent family history. History  Substance Use Topics  . Smoking status: Never Smoker   . Smokeless tobacco: Not on file  . Alcohol Use: Yes     Comment: social   OB History   Grav Para Term Preterm Abortions TAB SAB Ect Mult Living                 Review of Systems  Musculoskeletal: Positive for arthralgias (right knee, right hip) and back pain.      Allergies  Review of patient's allergies indicates no known allergies.  Home Medications   Prior to Admission medications   Medication Sig Start Date End Date Taking? Authorizing  Provider  acetaminophen (TYLENOL) 650 MG CR tablet Take 1,300 mg by mouth every 8 (eight) hours as needed for pain.   Yes Historical Provider, MD  Homeopathic Products ([employer] LEG CRAMPS PAIN RELIEF PO) Take 1 tablet by mouth as needed (for leg cramps).   Yes Historical Provider, MD  ibuprofen (ADVIL,MOTRIN) 200 MG tablet Take 400 mg by mouth every 6 (six) hours as needed for moderate pain.   Yes Historical Provider, MD  methocarbamol (ROBAXIN) 500 MG tablet Take 1 tablet (500 mg total) by mouth 2 (two) times daily. 09/22/13  Yes Tiffany Irine SealG Greene, PA-C  naproxen sodium (ANAPROX) 220 MG tablet Take 660 mg by mouth 2 (two) times daily with a meal.   Yes Historical Provider, MD   Triage Vitals: BP 148/100  Pulse 98  Temp(Src) 97.6 F (36.4 C) (Oral)  Resp 15  SpO2 100%  LMP 08/26/2013  Physical Exam  Nursing note and vitals reviewed. Constitutional: She is oriented to person, place, and time. She appears well-developed and well-nourished. No distress.  HENT:  Head: Normocephalic and atraumatic.  Eyes: Conjunctivae and EOM are normal. Right eye exhibits no discharge. Left eye exhibits no discharge. No scleral icterus.  Neck: Normal range of motion. Neck supple. No tracheal deviation present.  Cardiovascular: Normal rate, regular rhythm and normal heart sounds.  Exam reveals no gallop  and no friction rub.   No murmur heard. Pulmonary/Chest: Effort normal and breath sounds normal. No respiratory distress. She has no wheezes.  Abdominal: Soft. She exhibits no distension. There is no tenderness.  Musculoskeletal: Normal range of motion.  Right lumbar paraspinal muscles tender to palpation, no bony tenderness, step-offs, or gross abnormality or deformity of spine, patient is able to ambulate, moves all extremities  Bilateral great toe extension intact Bilateral plantar/dorsiflexion intact  Neurological: She is alert and oriented to person, place, and time. She has normal reflexes.  Sensation and  strength intact bilaterally Symmetrical reflexes  Skin: Skin is warm and dry. She is not diaphoretic.  Psychiatric: She has a normal mood and affect. Her behavior is normal. Judgment and thought content normal.    ED Course  Procedures (including critical care time)  DIAGNOSTIC STUDIES: Oxygen Saturation is 100% on room air, normal by my interpretation.    COORDINATION OF CARE: At 2308 Discussed treatment plan with patient which includes Toradol and Valium. Patient agrees.   Labs Review Labs Reviewed - No data to display  Imaging Review No results found.   EKG Interpretation None      MDM   Final diagnoses:  Sciatica, right    Patient with back pain.  No neurological deficits and normal neuro exam.  Patient is ambulatory.  No loss of bowel or bladder control.  Doubt cauda equina.  Denies fever,  doubt epidural abscess or other lesion. Recommend back exercises, stretching, RICE, and will treat with a short course of valium.  Encouraged the patient that there could be a need for additional workup and/or imaging such as MRI, if the symptoms do not resolve. Patient advised that if the back pain does not resolve, or radiates, this could progress to more serious conditions and is encouraged to follow-up with PCP or orthopedics within 2 weeks.     I personally performed the services described in this documentation, which was scribed in my presence. The recorded information has been reviewed and is accurate.    Roxy Horseman, PA-C 10/07/13 2310

## 2013-10-07 NOTE — ED Notes (Signed)
Pt states she was just here July 4th for same issues with right hip pain, right leg pain and right knee numbness. Pt states she was given pain medications and muscle relaxants but reports she still has to work and the medications make her tired. Pt is unable to afford to go to orthopedic doctor at this time. Pt is alert and oriented rates pain 8/10. Pt states "I cannot live like this."

## 2013-10-07 NOTE — Discharge Instructions (Signed)
Sciatica Sciatica is pain, weakness, numbness, or tingling along the path of the sciatic nerve. The nerve starts in the lower back and runs down the back of each leg. The nerve controls the muscles in the lower leg and in the back of the knee, while also providing sensation to the back of the thigh, lower leg, and the sole of your foot. Sciatica is a symptom of another medical condition. For instance, nerve damage or certain conditions, such as a herniated disk or bone spur on the spine, pinch or put pressure on the sciatic nerve. This causes the pain, weakness, or other sensations normally associated with sciatica. Generally, sciatica only affects one side of the body. CAUSES   Herniated or slipped disc.  Degenerative disk disease.  A pain disorder involving the narrow muscle in the buttocks (piriformis syndrome).  Pelvic injury or fracture.  Pregnancy.  Tumor (rare). SYMPTOMS  Symptoms can vary from mild to very severe. The symptoms usually travel from the low back to the buttocks and down the back of the leg. Symptoms can include:  Mild tingling or dull aches in the lower back, leg, or hip.  Numbness in the back of the calf or sole of the foot.  Burning sensations in the lower back, leg, or hip.  Sharp pains in the lower back, leg, or hip.  Leg weakness.  Severe back pain inhibiting movement. These symptoms may get worse with coughing, sneezing, laughing, or prolonged sitting or standing. Also, being overweight may worsen symptoms. DIAGNOSIS  Your caregiver will perform a physical exam to look for common symptoms of sciatica. He or she may ask you to do certain movements or activities that would trigger sciatic nerve pain. Other tests may be performed to find the cause of the sciatica. These may include:  Blood tests.  X-rays.  Imaging tests, such as an MRI or CT scan. TREATMENT  Treatment is directed at the cause of the sciatic pain. Sometimes, treatment is not necessary  and the pain and discomfort goes away on its own. If treatment is needed, your caregiver may suggest:  Over-the-counter medicines to relieve pain.  Prescription medicines, such as anti-inflammatory medicine, muscle relaxants, or narcotics.  Applying heat or ice to the painful area.  Steroid injections to lessen pain, irritation, and inflammation around the nerve.  Reducing activity during periods of pain.  Exercising and stretching to strengthen your abdomen and improve flexibility of your spine. Your caregiver may suggest losing weight if the extra weight makes the back pain worse.  Physical therapy.  Surgery to eliminate what is pressing or pinching the nerve, such as a bone spur or part of a herniated disk. HOME CARE INSTRUCTIONS   Only take over-the-counter or prescription medicines for pain or discomfort as directed by your caregiver.  Apply ice to the affected area for 20 minutes, 3-4 times a day for the first 48-72 hours. Then try heat in the same way.  Exercise, stretch, or perform your usual activities if these do not aggravate your pain.  Attend physical therapy sessions as directed by your caregiver.  Keep all follow-up appointments as directed by your caregiver.  Do not wear high heels or shoes that do not provide proper support.  Check your mattress to see if it is too soft. A firm mattress may lessen your pain and discomfort. SEEK IMMEDIATE MEDICAL CARE IF:   You lose control of your bowel or bladder (incontinence).  You have increasing weakness in the lower back, pelvis, buttocks,   or legs.  You have redness or swelling of your back.  You have a burning sensation when you urinate.  You have pain that gets worse when you lie down or awakens you at night.  Your pain is worse than you have experienced in the past.  Your pain is lasting longer than 4 weeks.  You are suddenly losing weight without reason. MAKE SURE YOU:  Understand these  instructions.  Will watch your condition.  Will get help right away if you are not doing well or get worse. Document Released: 03/05/2001 Document Revised: 09/10/2011 Document Reviewed: 07/21/2011 Exit[employer] Patient Information 2015 [employer]. This information is not intended to replace advice given to you by your health care provider. Make sure you discuss any questions you have with your health care provider.  

## 2013-10-10 NOTE — ED Provider Notes (Signed)
Medical screening examination/treatment/procedure(s) were performed by non-physician practitioner and as supervising physician I was immediately available for consultation/collaboration.  Maziah Smola T Marylyn Appenzeller, MD 10/10/13 0802 

## 2013-10-12 ENCOUNTER — Other Ambulatory Visit: Payer: Self-pay | Admitting: Physician Assistant

## 2013-10-12 DIAGNOSIS — M5416 Radiculopathy, lumbar region: Secondary | ICD-10-CM

## 2013-10-12 DIAGNOSIS — M5441 Lumbago with sciatica, right side: Secondary | ICD-10-CM

## 2013-10-15 ENCOUNTER — Ambulatory Visit
Admission: RE | Admit: 2013-10-15 | Discharge: 2013-10-15 | Disposition: A | Discharge status: Home / Self Care | Payer: BC Managed Care – PPO | Source: Ambulatory Visit | Attending: Physician Assistant | Admitting: Physician Assistant

## 2013-10-15 DIAGNOSIS — M5416 Radiculopathy, lumbar region: Secondary | ICD-10-CM

## 2013-10-15 DIAGNOSIS — M5441 Lumbago with sciatica, right side: Secondary | ICD-10-CM

## 2013-12-28 ENCOUNTER — Emergency Department (HOSPITAL_COMMUNITY)
Admission: EM | Admit: 2013-12-28 | Discharge: 2013-12-28 | Disposition: A | Discharge status: Home / Self Care | Payer: BC Managed Care – PPO | Source: Home / Self Care

## 2014-01-14 ENCOUNTER — Encounter (HOSPITAL_COMMUNITY): Payer: Self-pay | Admitting: Emergency Medicine

## 2014-01-14 ENCOUNTER — Emergency Department (HOSPITAL_COMMUNITY)
Admission: EM | Admit: 2014-01-14 | Discharge: 2014-01-14 | Disposition: A | Discharge status: Home / Self Care | Payer: BC Managed Care – PPO | Attending: Emergency Medicine | Admitting: Emergency Medicine

## 2014-01-14 DIAGNOSIS — R0981 Nasal congestion: Secondary | ICD-10-CM | POA: Diagnosis present

## 2014-01-14 DIAGNOSIS — J45909 Unspecified asthma, uncomplicated: Secondary | ICD-10-CM | POA: Diagnosis not present

## 2014-01-14 DIAGNOSIS — R110 Nausea: Secondary | ICD-10-CM | POA: Diagnosis not present

## 2014-01-14 DIAGNOSIS — Z79899 Other long term (current) drug therapy: Secondary | ICD-10-CM | POA: Diagnosis not present

## 2014-01-14 DIAGNOSIS — J069 Acute upper respiratory infection, unspecified: Secondary | ICD-10-CM | POA: Diagnosis not present

## 2014-01-14 DIAGNOSIS — Z791 Long term (current) use of non-steroidal anti-inflammatories (NSAID): Secondary | ICD-10-CM | POA: Insufficient documentation

## 2014-01-14 MED ORDER — ALBUTEROL SULFATE HFA 108 (90 BASE) MCG/ACT IN AERS
2.0000 | INHALATION_SPRAY | RESPIRATORY_TRACT | Status: DC | PRN
  Administered 2014-01-14: 2 via RESPIRATORY_TRACT
  Filled 2014-01-14: qty 6.7

## 2014-01-14 MED ORDER — PROMETHAZINE HCL 25 MG PO TABS: 25.0000 mg | ORAL_TABLET | Freq: Four times a day (QID) | ORAL | Status: DC | PRN

## 2014-01-14 MED ORDER — GUAIFENESIN 100 MG/5ML PO LIQD: 100.0000 mg | ORAL | Status: DC | PRN

## 2014-01-14 MED ORDER — IBUPROFEN 200 MG PO TABS
600.0000 mg | ORAL_TABLET | Freq: Once | ORAL | Status: AC
  Administered 2014-01-14: 600 mg via ORAL
  Filled 2014-01-14: qty 3

## 2014-01-14 NOTE — ED Notes (Addendum)
Per pt, states body aches, dry cough and nausea since Tues
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
## 2014-01-17 NOTE — ED Provider Notes (Signed)
Medical screening examination/treatment/procedure(s) were performed by non-physician practitioner and as supervising physician I was immediately available for consultation/collaboration.   EKG Interpretation None       Raeford RazorStephen Torrence Hammack, MD 01/17/14 1254

## 2014-04-25 ENCOUNTER — Encounter (HOSPITAL_COMMUNITY): Payer: Self-pay | Admitting: Emergency Medicine

## 2014-04-25 ENCOUNTER — Emergency Department (HOSPITAL_COMMUNITY)
Admission: EM | Admit: 2014-04-25 | Discharge: 2014-04-25 | Disposition: A | Discharge status: Home / Self Care | Payer: 59 | Attending: Emergency Medicine | Admitting: Emergency Medicine

## 2014-04-25 DIAGNOSIS — Z79899 Other long term (current) drug therapy: Secondary | ICD-10-CM | POA: Insufficient documentation

## 2014-04-25 DIAGNOSIS — S8992XA Unspecified injury of left lower leg, initial encounter: Secondary | ICD-10-CM | POA: Insufficient documentation

## 2014-04-25 DIAGNOSIS — J45909 Unspecified asthma, uncomplicated: Secondary | ICD-10-CM | POA: Insufficient documentation

## 2014-04-25 DIAGNOSIS — Y9389 Activity, other specified: Secondary | ICD-10-CM | POA: Insufficient documentation

## 2014-04-25 DIAGNOSIS — W1839XA Other fall on same level, initial encounter: Secondary | ICD-10-CM | POA: Insufficient documentation

## 2014-04-25 DIAGNOSIS — M25562 Pain in left knee: Secondary | ICD-10-CM

## 2014-04-25 DIAGNOSIS — Y998 Other external cause status: Secondary | ICD-10-CM | POA: Insufficient documentation

## 2014-04-25 DIAGNOSIS — Y9289 Other specified places as the place of occurrence of the external cause: Secondary | ICD-10-CM | POA: Insufficient documentation

## 2014-04-25 MED ORDER — IBUPROFEN 800 MG PO TABS: 800.0000 mg | ORAL_TABLET | Freq: Three times a day (TID) | ORAL | Status: DC

## 2014-04-25 MED ORDER — HYDROCODONE-ACETAMINOPHEN 5-325 MG PO TABS: 1.0000 | ORAL_TABLET | Freq: Four times a day (QID) | ORAL | Status: DC | PRN

## 2014-04-25 NOTE — ED Provider Notes (Signed)
CSN: [id number]     Arrival date & time 04/25/14  1127 History  This chart was scribed for Clabe Seal, PA-C, working with Geoffery Lyons, MD by Jolene Provost, ED Scribe. This patient was seen in room WTR8/WTR8 and the patient's care was started at 12:18 PM.     Chief Complaint  Patient presents with  . Knee Pain     (Consider location/radiation/quality/duration/timing/severity/associated sxs/prior Treatment) The history is provided by the patient. No language interpreter was used.    HPI Comments: Molly Ford is a 34 y.o. female who presents to the Emergency Department complaining of left knee pain for the last three weeks. Pt states the pain began three weeks ago while she was walking on the tread mill. Pt states she fell two days ago due to pain. Pt states she has not seen an orthopedic doctor for her knee. Walking up stairs and down stairs exacerbates the symptoms. She has been able to ambulate without assistance. Pt states she had a recent back surgery performed by a neurosurgeon. She does not associate knee discomfort with back discomfort.    Past Medical History  Diagnosis Date  . Asthma    Past Surgical History  Procedure Laterality Date  . Cesarean section    . Back surgery     History reviewed. No pertinent family history. History  Substance Use Topics  . Smoking status: Never Smoker   . Smokeless tobacco: Not on file  . Alcohol Use: Yes     Comment: social   OB History    No data available     Review of Systems  Constitutional: Negative for fever and chills.  Musculoskeletal: Positive for arthralgias. Negative for back pain and joint swelling.  Skin: Negative for wound.      Allergies  Review of patient's allergies indicates no known allergies.  Home Medications   Prior to Admission medications   Medication Sig Start Date End Date Taking? Authorizing Provider  acetaminophen (TYLENOL) 650 MG CR tablet Take 1,300 mg by mouth every 8 (eight) hours as  needed for pain.   Yes Historical Provider, MD  albuterol (PROVENTIL HFA;VENTOLIN HFA) 108 (90 BASE) MCG/ACT inhaler Inhale 2 puffs into the lungs every 4 (four) hours as needed for wheezing or shortness of breath.   Yes Historical Provider, MD  ibuprofen (ADVIL,MOTRIN) 200 MG tablet Take 400 mg by mouth every 6 (six) hours as needed for moderate pain.   Yes Historical Provider, MD  diazepam (VALIUM) 5 MG tablet Take 1 tablet (5 mg total) by mouth 2 (two) times daily. Patient not taking: Reported on 04/25/2014 10/07/13   Roxy Horseman, PA-C  guaiFENesin (ROBITUSSIN) 100 MG/5ML liquid Take 5-10 mLs (100-200 mg total) by mouth every 4 (four) hours as needed for cough. Patient not taking: Reported on 04/25/2014 01/14/14   Junius Finner, PA-C  methocarbamol (ROBAXIN) 500 MG tablet Take 1 tablet (500 mg total) by mouth 2 (two) times daily. Patient not taking: Reported on 04/25/2014 09/22/13   Dorthula Matas, PA-C  promethazine (PHENERGAN) 25 MG tablet Take 1 tablet (25 mg total) by mouth every 6 (six) hours as needed for nausea or vomiting. Patient not taking: Reported on 04/25/2014 01/14/14   Junius Finner, PA-C   BP 132/90 mmHg  Pulse 92  Temp(Src) 98.2 F (36.8 C) (Oral)  Resp 18  SpO2 98% Physical Exam  Constitutional: She is oriented to person, place, and time. She appears well-developed and well-nourished. No distress.  Obese female.  HENT:  Head: Normocephalic and atraumatic.  Neck: Neck supple.  Pulmonary/Chest: Effort normal. No respiratory distress.  Musculoskeletal:       Left knee: She exhibits normal range of motion, no swelling, no deformity, no erythema and no MCL laxity. Tenderness found. Lateral joint line tenderness noted.  Normal sensation and strength to left lower extremity.  Neurological: She is alert and oriented to person, place, and time.  Skin: Skin is warm and dry. She is not diaphoretic.  Psychiatric: She has a normal mood and affect. Her behavior is normal.  Nursing  note and vitals reviewed.   ED Course  Procedures   Labs Review Labs Reviewed - No data to display  Imaging Review No results found.   EKG Interpretation None      MDM   Final diagnoses:  Left knee pain   Patient with knee pain for 3 weeks, recently gave out due to pain. Patient declines x-ray at this time likely meniscal or soft tissue injury. Plan to treat with ice, anti-inflammatory, crutches, knee sleeve and follow-up with orthopedist. Meds given in ED:  Medications - No data to display  New Prescriptions   HYDROCODONE-ACETAMINOPHEN (NORCO/VICODIN) 5-325 MG PER TABLET    Take 1 tablet by mouth every 6 (six) hours as needed.   IBUPROFEN (ADVIL,MOTRIN) 800 MG TABLET    Take 1 tablet (800 mg total) by mouth 3 (three) times daily.   I personally performed the services described in this documentation, which was scribed in my presence. The recorded information has been reviewed and is accurate.   Mellody DrownLauren Jama Krichbaum, PA-C 04/25/14 1250  Geoffery Lyonsouglas Delo, MD 04/26/14 [phone]

## 2014-04-25 NOTE — ED Notes (Signed)
Pt c/o left knee pain x 3 weeks, states knee "gave out" 2 days ago while she was ambulating. 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

## 2014-04-25 NOTE — Discharge Instructions (Signed)
Call for a follow up appointment with an orthopedic specialist. Return if Symptoms worsen.   Take medication as prescribed.  Ice your knee 3-4 times a day, elevate above your heart to reduce swelling.  Use crutches as needed.

## 2015-01-04 ENCOUNTER — Encounter (HOSPITAL_COMMUNITY): Payer: Self-pay | Admitting: Emergency Medicine

## 2015-01-04 ENCOUNTER — Emergency Department (HOSPITAL_COMMUNITY): Payer: No Typology Code available for payment source

## 2015-01-04 ENCOUNTER — Emergency Department (HOSPITAL_COMMUNITY)
Admission: EM | Admit: 2015-01-04 | Discharge: 2015-01-04 | Disposition: A | Discharge status: Home / Self Care | Payer: No Typology Code available for payment source | Attending: Emergency Medicine | Admitting: Emergency Medicine

## 2015-01-04 DIAGNOSIS — S3992XA Unspecified injury of lower back, initial encounter: Secondary | ICD-10-CM | POA: Insufficient documentation

## 2015-01-04 DIAGNOSIS — S161XXA Strain of muscle, fascia and tendon at neck level, initial encounter: Secondary | ICD-10-CM | POA: Diagnosis not present

## 2015-01-04 DIAGNOSIS — Y998 Other external cause status: Secondary | ICD-10-CM | POA: Diagnosis not present

## 2015-01-04 DIAGNOSIS — S4992XA Unspecified injury of left shoulder and upper arm, initial encounter: Secondary | ICD-10-CM | POA: Insufficient documentation

## 2015-01-04 DIAGNOSIS — S199XXA Unspecified injury of neck, initial encounter: Secondary | ICD-10-CM | POA: Diagnosis present

## 2015-01-04 DIAGNOSIS — Y9389 Activity, other specified: Secondary | ICD-10-CM | POA: Diagnosis not present

## 2015-01-04 DIAGNOSIS — J45909 Unspecified asthma, uncomplicated: Secondary | ICD-10-CM | POA: Diagnosis not present

## 2015-01-04 DIAGNOSIS — Z79899 Other long term (current) drug therapy: Secondary | ICD-10-CM | POA: Diagnosis not present

## 2015-01-04 DIAGNOSIS — Y92410 Unspecified street and highway as the place of occurrence of the external cause: Secondary | ICD-10-CM | POA: Insufficient documentation

## 2015-01-04 MED ORDER — IBUPROFEN 800 MG PO TABS: 800.0000 mg | ORAL_TABLET | Freq: Three times a day (TID) | ORAL | Status: DC

## 2015-01-04 MED ORDER — IBUPROFEN 800 MG PO TABS
800.0000 mg | ORAL_TABLET | Freq: Once | ORAL | Status: AC
  Administered 2015-01-04: 800 mg via ORAL
  Filled 2015-01-04: qty 1
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
## 2015-01-04 NOTE — Discharge Instructions (Signed)
Cervical Sprain  A cervical sprain is an injury in the neck in which the strong, fibrous tissues (ligaments) that connect your neck bones stretch or tear. Cervical sprains can range from mild to severe. Severe cervical sprains can cause the neck vertebrae to be unstable. This can lead to damage of the spinal cord and can result in serious nervous system problems. The amount of time it takes for a cervical sprain to get better depends on the cause and extent of the injury. Most cervical sprains heal in 1 to 3 weeks.  CAUSES   Severe cervical sprains may be caused by:    Contact sport injuries (such as from football, rugby, wrestling, hockey, auto racing, gymnastics, diving, martial arts, or boxing).    Motor vehicle collisions.    Whiplash injuries. This is an injury from a sudden forward and backward whipping movement of the head and neck.   Falls.   Mild cervical sprains may be caused by:    Being in an awkward position, such as while cradling a telephone between your ear and shoulder.    Sitting in a chair that does not offer proper support.    Working at a poorly designed computer station.    Looking up or down for long periods of time.   SYMPTOMS    Pain, soreness, stiffness, or a burning sensation in the front, back, or sides of the neck. This discomfort may develop immediately after the injury or slowly, 24 hours or more after the injury.    Pain or tenderness directly in the middle of the back of the neck.    Shoulder or upper back pain.    Limited ability to move the neck.    Headache.    Dizziness.    Weakness, numbness, or tingling in the hands or arms.    Muscle spasms.    Difficulty swallowing or chewing.    Tenderness and swelling of the neck.   DIAGNOSIS   Most of the time your health care provider can diagnose a cervical sprain by taking your history and doing a physical exam. Your health care provider will ask about previous neck injuries and any known neck  problems, such as arthritis in the neck. X-rays may be taken to find out if there are any other problems, such as with the bones of the neck. Other tests, such as a CT scan or MRI, may also be needed.   TREATMENT   Treatment depends on the severity of the cervical sprain. Mild sprains can be treated with rest, keeping the neck in place (immobilization), and pain medicines. Severe cervical sprains are immediately immobilized. Further treatment is done to help with pain, muscle spasms, and other symptoms and may include:   Medicines, such as pain relievers, numbing medicines, or muscle relaxants.    Physical therapy. This may involve stretching exercises, strengthening exercises, and posture training. Exercises and improved posture can help stabilize the neck, strengthen muscles, and help stop symptoms from returning.   HOME CARE INSTRUCTIONS    Put ice on the injured area.     Put ice in a plastic bag.     Place a towel between your skin and the bag.     Leave the ice on for 15-20 minutes, 3-4 times a day.    If your injury was severe, you may have been given a cervical collar to wear. A cervical collar is a two-piece collar designed to keep your neck from moving while it heals.      Do not remove the collar unless instructed by your health care provider.    If you have long hair, keep it outside of the collar.    Ask your health care provider before making any adjustments to your collar. Minor adjustments may be required over time to improve comfort and reduce pressure on your chin or on the back of your head.    Ifyou are allowed to remove the collar for cleaning or bathing, follow your health care provider's instructions on how to do so safely.    Keep your collar clean by wiping it with mild soap and water and drying it completely. If the collar you have been given includes removable pads, remove them every 1-2 days and hand wash them with soap and water. Allow them to air dry. They should be completely  dry before you wear them in the collar.    If you are allowed to remove the collar for cleaning and bathing, wash and dry the skin of your neck. Check your skin for irritation or sores. If you see any, tell your health care provider.    Do not drive while wearing the collar.    Only take over-the-counter or prescription medicines for pain, discomfort, or fever as directed by your health care provider.    Keep all follow-up appointments as directed by your health care provider.    Keep all physical therapy appointments as directed by your health care provider.    Make any needed adjustments to your workstation to promote good posture.    Avoid positions and activities that make your symptoms worse.    Warm up and stretch before being active to help prevent problems.   SEEK MEDICAL CARE IF:    Your pain is not controlled with medicine.    You are unable to decrease your pain medicine over time as planned.    Your activity level is not improving as expected.   SEEK IMMEDIATE MEDICAL CARE IF:    You develop any bleeding.   You develop stomach upset.   You have signs of an allergic reaction to your medicine.    Your symptoms get worse.    You develop new, unexplained symptoms.    You have numbness, tingling, weakness, or paralysis in any part of your body.   MAKE SURE YOU:    Understand these instructions.   Will watch your condition.   Will get help right away if you are not doing well or get worse.     This information is not intended to replace advice given to you by your health care provider. Make sure you discuss any questions you have with your health care provider.     Document Released: 01/06/2007 Document Revised: 03/16/2013 Document Reviewed: 09/16/2012  Elsevier Interactive Patient Education 2016 [employer]

## 2015-01-08 ENCOUNTER — Emergency Department (HOSPITAL_COMMUNITY): Payer: No Typology Code available for payment source

## 2015-01-08 ENCOUNTER — Emergency Department (HOSPITAL_COMMUNITY)
Admission: EM | Admit: 2015-01-08 | Discharge: 2015-01-08 | Disposition: A | Discharge status: Home / Self Care | Payer: No Typology Code available for payment source | Attending: Physician Assistant | Admitting: Physician Assistant

## 2015-01-08 ENCOUNTER — Encounter (HOSPITAL_COMMUNITY): Payer: Self-pay | Admitting: *Deleted

## 2015-01-08 DIAGNOSIS — Z791 Long term (current) use of non-steroidal anti-inflammatories (NSAID): Secondary | ICD-10-CM | POA: Insufficient documentation

## 2015-01-08 DIAGNOSIS — Y9389 Activity, other specified: Secondary | ICD-10-CM | POA: Insufficient documentation

## 2015-01-08 DIAGNOSIS — G8929 Other chronic pain: Secondary | ICD-10-CM | POA: Insufficient documentation

## 2015-01-08 DIAGNOSIS — S199XXA Unspecified injury of neck, initial encounter: Secondary | ICD-10-CM | POA: Diagnosis not present

## 2015-01-08 DIAGNOSIS — S3992XA Unspecified injury of lower back, initial encounter: Secondary | ICD-10-CM | POA: Diagnosis not present

## 2015-01-08 DIAGNOSIS — M25512 Pain in left shoulder: Secondary | ICD-10-CM

## 2015-01-08 DIAGNOSIS — S4992XA Unspecified injury of left shoulder and upper arm, initial encounter: Secondary | ICD-10-CM | POA: Diagnosis present

## 2015-01-08 DIAGNOSIS — S40012A Contusion of left shoulder, initial encounter: Secondary | ICD-10-CM | POA: Insufficient documentation

## 2015-01-08 DIAGNOSIS — Y92410 Unspecified street and highway as the place of occurrence of the external cause: Secondary | ICD-10-CM | POA: Diagnosis not present

## 2015-01-08 DIAGNOSIS — Z79899 Other long term (current) drug therapy: Secondary | ICD-10-CM | POA: Diagnosis not present

## 2015-01-08 DIAGNOSIS — T148XXA Other injury of unspecified body region, initial encounter: Secondary | ICD-10-CM

## 2015-01-08 DIAGNOSIS — Y998 Other external cause status: Secondary | ICD-10-CM | POA: Diagnosis not present

## 2015-01-08 DIAGNOSIS — S299XXA Unspecified injury of thorax, initial encounter: Secondary | ICD-10-CM | POA: Diagnosis not present

## 2015-01-08 DIAGNOSIS — J45909 Unspecified asthma, uncomplicated: Secondary | ICD-10-CM | POA: Diagnosis not present

## 2015-01-08 LAB — URINALYSIS, ROUTINE W REFLEX MICROSCOPIC
Bilirubin Urine: NEGATIVE
Glucose, UA: NEGATIVE mg/dL
Hgb urine dipstick: NEGATIVE
Ketones, ur: NEGATIVE mg/dL
Leukocytes, UA: NEGATIVE
Nitrite: NEGATIVE
Protein, ur: NEGATIVE mg/dL
Specific Gravity, Urine: 1.014 (ref 1.005–1.030)
Urobilinogen, UA: 0.2 mg/dL (ref 0.0–1.0)
pH: 6 (ref 5.0–8.0)

## 2015-01-08 MED ORDER — TRAMADOL HCL 50 MG PO TABS: 50.0000 mg | ORAL_TABLET | Freq: Four times a day (QID) | ORAL | Status: DC | PRN

## 2015-01-08 NOTE — ED Notes (Signed)
Awake. Verbally responsive. A/O x4. Resp even and unlabored. No audible adventitious breath sounds noted. ABC's intact.  

## 2015-01-08 NOTE — ED Notes (Signed)
Bed: WTR9 Expected date:  Expected time:  Means of arrival:  Comments: 
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
## 2015-01-08 NOTE — Discharge Instructions (Signed)
Shoulder Pain  The shoulder is the joint that connects your arms to your body. The bones that form the shoulder joint include the upper arm bone (humerus), the shoulder blade (scapula), and the collarbone (clavicle). The top of the humerus is shaped like a ball and fits into a rather flat socket on the scapula (glenoid cavity). A combination of muscles and strong, fibrous tissues that connect muscles to bones (tendons) support your shoulder joint and hold the ball in the socket. Small, fluid-filled sacs (bursae) are located in different areas of the joint. They act as cushions between the bones and the overlying soft tissues and help reduce friction between the gliding tendons and the bone as you move your arm. Your shoulder joint allows a wide range of motion in your arm. This range of motion allows you to do things like scratch your back or throw a ball. However, this range of motion also makes your shoulder more prone to pain from overuse and injury.  Causes of shoulder pain can originate from both injury and overuse and usually can be grouped in the following four categories:   Redness, swelling, and pain (inflammation) of the tendon (tendinitis) or the bursae (bursitis).   Instability, such as a dislocation of the joint.   Inflammation of the joint (arthritis).   Broken bone (fracture).  HOME CARE INSTRUCTIONS    Apply ice to the sore area.    Put ice in a plastic bag.    Place a towel between your skin and the bag.    Leave the ice on for 15-20 minutes, 3-4 times per day for the first 2 days, or as directed by your health care provider.   Stop using cold packs if they do not help with the pain.   If you have a shoulder sling or immobilizer, wear it as long as your caregiver instructs. Only remove it to shower or bathe. Move your arm as little as possible, but keep your hand moving to prevent swelling.   Squeeze a soft ball or foam pad as much as possible to help prevent swelling.   Only take  over-the-counter or prescription medicines for pain, discomfort, or fever as directed by your caregiver.  SEEK MEDICAL CARE IF:    Your shoulder pain increases, or new pain develops in your arm, hand, or fingers.   Your hand or fingers become cold and numb.   Your pain is not relieved with medicines.  SEEK IMMEDIATE MEDICAL CARE IF:    Your arm, hand, or fingers are numb or tingling.   Your arm, hand, or fingers are significantly swollen or turn white or blue.  MAKE SURE YOU:    Understand these instructions.   Will watch your condition.   Will get help right away if you are not doing well or get worse.     This information is not intended to replace advice given to you by your health care provider. Make sure you discuss any questions you have with your health care provider.     Document Released: 12/19/2004 Document Revised: 04/01/2014 Document Reviewed: 07/04/2014  Elsevier Interactive Patient Education 2016 [employer]  Motor Vehicle Collision  It is common to have multiple bruises and sore muscles after a motor vehicle collision (MVC). These tend to feel worse for the first 24 hours. You may have the most stiffness and soreness over the first several hours. You may also feel worse when you wake up the first morning after your collision. After this   point, you will usually begin to improve with each day. The speed of improvement often depends on the severity of the collision, the number of injuries, and the location and nature of these injuries.  HOME CARE INSTRUCTIONS   Put ice on the injured area.   Put ice in a plastic bag.   Place a towel between your skin and the bag.   Leave the ice on for 15-20 minutes, 3-4 times a day, or as directed by your health care provider.   Drink enough fluids to keep your urine clear or pale yellow. Do not drink alcohol.   Take a warm shower or bath once or twice a day. This will increase blood flow to sore muscles.   You may return to activities as directed by  your caregiver. Be careful when lifting, as this may aggravate neck or back pain.   Only take over-the-counter or prescription medicines for pain, discomfort, or fever as directed by your caregiver. Do not use aspirin. This may increase bruising and bleeding.  SEEK IMMEDIATE MEDICAL CARE IF:   You have numbness, tingling, or weakness in the arms or legs.   You develop severe headaches not relieved with medicine.   You have severe neck pain, especially tenderness in the middle of the back of your neck.   You have changes in bowel or bladder control.   There is increasing pain in any area of the body.   You have shortness of breath, light-headedness, dizziness, or fainting.   You have chest pain.   You feel sick to your stomach (nauseous), throw up (vomit), or sweat.   You have increasing abdominal discomfort.   There is blood in your urine, stool, or vomit.   You have pain in your shoulder (shoulder strap areas).   You feel your symptoms are getting worse.  MAKE SURE YOU:   Understand these instructions.   Will watch your condition.   Will get help right away if you are not doing well or get worse.     This information is not intended to replace advice given to you by your health care provider. Make sure you discuss any questions you have with your health care provider.     Document Released: 03/11/2005 Document Revised: 04/01/2014 Document Reviewed: 08/08/2010  Elsevier Interactive Patient Education 2016 [employer]

## 2015-02-07 ENCOUNTER — Encounter (HOSPITAL_COMMUNITY): Payer: Self-pay | Admitting: Family Medicine

## 2015-02-07 ENCOUNTER — Emergency Department (HOSPITAL_COMMUNITY)
Admission: EM | Admit: 2015-02-07 | Discharge: 2015-02-08 | Disposition: A | Discharge status: Home / Self Care | Payer: 59 | Attending: Physician Assistant | Admitting: Physician Assistant

## 2015-02-07 DIAGNOSIS — J45909 Unspecified asthma, uncomplicated: Secondary | ICD-10-CM | POA: Insufficient documentation

## 2015-02-07 DIAGNOSIS — G44209 Tension-type headache, unspecified, not intractable: Secondary | ICD-10-CM | POA: Insufficient documentation

## 2015-02-07 DIAGNOSIS — Z79899 Other long term (current) drug therapy: Secondary | ICD-10-CM | POA: Insufficient documentation

## 2015-02-07 LAB — CBG MONITORING, ED: Glucose-Capillary: 104 mg/dL — ABNORMAL HIGH (ref 65–99)

## 2015-02-07 NOTE — Progress Notes (Signed)
Patient listed as not having insurance or a pcp.  EDCM spoke to patient at bedside.  Patient reports she has a Medicaid card but it might be [employer], patient unsure.  EDCM spoke to registration who confirms patient's insurance is Medicaid Family Planning.  [facility], informed patient of services there.  EDCM also provided patient with list of pcps who accept self pay patients, list of discount pharmacies and websites needymeds.org and GoodRX.com for medication assistance, phone number to inquire about the orange card, phone number to inquire about Mediciad, phone number to inquire about the Affordable Care Act, financial resources in the community such as local churches, [employer], urban ministries, and dental assistance for uninsured patients.  Patient thankful for resources.  No further EDCM needs at this time.

## 2015-02-07 NOTE — Discharge Instructions (Signed)

## 2015-02-07 NOTE — ED Provider Notes (Signed)
CSN: [id number]     Arrival date & time 02/07/15  2000 History   First MD Initiated Contact with Patient 02/07/15 2142     Chief Complaint  Patient presents with  . Nausea  . Migraine     (Consider location/radiation/quality/duration/timing/severity/associated sxs/prior Treatment) Patient is a 34 y.o. female presenting with migraines. The history is provided by the patient. No language interpreter was used.  Migraine This is a recurrent problem. The current episode started today. The problem occurs intermittently. The problem has been resolved. Associated symptoms include headaches and nausea. Pertinent negatives include no abdominal pain, neck pain, numbness or weakness. Nothing aggravates the symptoms. She has tried nothing for the symptoms. The treatment provided significant relief.  Pt has had multiple episodes of headaches.  Pt had a headache today.  Pt reports headache resolved with ibuprofen.  Pt reports she is worried about diabetes. Pt reports her grandfather was diabetic and that she has been worried about her blood pressure and diabetes.   Pt reports headaches start at work.  Pt reports increased stress  Past Medical History  Diagnosis Date  . Asthma    Past Surgical History  Procedure Laterality Date  . Cesarean section    . Back surgery     History reviewed. No pertinent family history. Social History  Substance Use Topics  . Smoking status: Never Smoker   . Smokeless tobacco: Never Used  . Alcohol Use: Yes     Comment: once a month   OB History    No data available     Review of Systems  Gastrointestinal: Positive for nausea. Negative for abdominal pain.  Musculoskeletal: Negative for neck pain.  Neurological: Positive for headaches. Negative for weakness and numbness.  All other systems reviewed and are negative.     Allergies  Review of patient's allergies indicates no known allergies.  Home Medications   Prior to Admission medications   Medication  Sig Start Date End Date Taking? Authorizing Provider  albuterol (PROVENTIL HFA;VENTOLIN HFA) 108 (90 BASE) MCG/ACT inhaler Inhale 2 puffs into the lungs every 4 (four) hours as needed for wheezing or shortness of breath.   Yes Historical Provider, MD  ibuprofen (ADVIL,MOTRIN) 200 MG tablet Take 200 mg by mouth every 6 (six) hours as needed for headache.   Yes Historical Provider, MD  HYDROcodone-acetaminophen (NORCO/VICODIN) 5-325 MG per tablet Take 1 tablet by mouth every 6 (six) hours as needed. Patient not taking: Reported on 01/04/2015 04/25/14   Mellody Drown, PA-C  ibuprofen (ADVIL,MOTRIN) 800 MG tablet Take 1 tablet (800 mg total) by mouth 3 (three) times daily. Patient not taking: Reported on 02/07/2015 01/04/15   Urban Gibson, MD  traMADol (ULTRAM) 50 MG tablet Take 1 tablet (50 mg total) by mouth every 6 (six) hours as needed. Patient not taking: Reported on 02/07/2015 01/08/15   Marlon Pel, PA-C   BP 131/87 mmHg  Pulse 83  Temp(Src) 98.3 F (36.8 C) (Oral)  Resp 18  Ht  (1.626 m)  Wt 250 lb (113.399 kg)  BMI 42.89 kg/m2  SpO2 100%  LMP 01/29/2015 Physical Exam  Constitutional: She is oriented to person, place, and time. She appears well-developed and well-nourished.  HENT:  Head: Normocephalic and atraumatic.  Eyes: Conjunctivae and EOM are normal. Pupils are equal, round, and reactive to light.  Neck: Normal range of motion.  Cardiovascular: Normal rate and normal heart sounds.   Pulmonary/Chest: Effort normal.  Abdominal: Soft. She exhibits no distension.  Musculoskeletal: Normal range  of motion.  Neurological: She is alert and oriented to person, place, and time.  Skin: Skin is warm.  Psychiatric: She has a normal mood and affect.  Nursing note and vitals reviewed.   ED Course  Procedures (including critical care time) Labs Review Labs Reviewed - No data to display  Imaging Review No results found. I have personally reviewed and evaluated these images  and lab results as part of my medical decision-making.   EKG Interpretation None      MDM Pt has a normal exam.  She does not currently have a headache.  Pt has a cbg of 104.  I suspect headaches are second to stress.  Pt advised to follow up with her MD for evaluation.   Continue current medication as needed   Final diagnoses:  Tension-type headache, not intractable, unspecified chronicity pattern        Elson AreasLeslie K Fahima Cifelli, PA-C 02/07/15 2341  Courteney Randall AnLyn Mackuen, MD 02/08/15 [phone]

## 2015-02-07 NOTE — ED Notes (Signed)
Patient states every time she comes from work, she is experiencing migraine (behind left eye) with nausea. Pt states this has been going "for a while".

## 2015-04-25 ENCOUNTER — Emergency Department (HOSPITAL_COMMUNITY)
Admission: EM | Admit: 2015-04-25 | Discharge: 2015-04-25 | Disposition: A | Discharge status: Home / Self Care | Payer: 59 | Attending: Emergency Medicine | Admitting: Emergency Medicine

## 2015-04-25 ENCOUNTER — Encounter (HOSPITAL_COMMUNITY): Payer: Self-pay

## 2015-04-25 DIAGNOSIS — R000 Tachycardia, unspecified: Secondary | ICD-10-CM | POA: Insufficient documentation

## 2015-04-25 DIAGNOSIS — R1084 Generalized abdominal pain: Secondary | ICD-10-CM

## 2015-04-25 DIAGNOSIS — J45909 Unspecified asthma, uncomplicated: Secondary | ICD-10-CM | POA: Insufficient documentation

## 2015-04-25 DIAGNOSIS — Z79899 Other long term (current) drug therapy: Secondary | ICD-10-CM | POA: Insufficient documentation

## 2015-04-25 DIAGNOSIS — R112 Nausea with vomiting, unspecified: Secondary | ICD-10-CM | POA: Insufficient documentation

## 2015-04-25 DIAGNOSIS — R231 Pallor: Secondary | ICD-10-CM | POA: Insufficient documentation

## 2015-04-25 DIAGNOSIS — Z3202 Encounter for pregnancy test, result negative: Secondary | ICD-10-CM | POA: Insufficient documentation

## 2015-04-25 DIAGNOSIS — R197 Diarrhea, unspecified: Secondary | ICD-10-CM | POA: Insufficient documentation

## 2015-04-25 LAB — COMPREHENSIVE METABOLIC PANEL
ALT: 17 U/L (ref 14–54)
AST: 15 U/L (ref 15–41)
Albumin: 3.9 g/dL (ref 3.5–5.0)
Alkaline Phosphatase: 87 U/L (ref 38–126)
Anion gap: 8 (ref 5–15)
BUN: 12 mg/dL (ref 6–20)
CO2: 22 mmol/L (ref 22–32)
Calcium: 8.6 mg/dL — ABNORMAL LOW (ref 8.9–10.3)
Chloride: 108 mmol/L (ref 101–111)
Creatinine, Ser: 0.71 mg/dL (ref 0.44–1.00)
GFR calc Af Amer: >60 mL/min (ref >60)
GFR calc non Af Amer: >60 mL/min (ref >60)
Glucose, Bld: 105 mg/dL — ABNORMAL HIGH (ref 65–99)
Potassium: 3.8 mmol/L (ref 3.5–5.1)
Sodium: 138 mmol/L (ref 135–145)
Total Bilirubin: 1.2 mg/dL (ref 0.3–1.2)
Total Protein: 7.8 g/dL (ref 6.5–8.1)

## 2015-04-25 LAB — CBC WITH DIFFERENTIAL/PLATELET
Basophils Absolute: 0 10*3/uL (ref 0.0–0.1)
Basophils Relative: 0 %
Eosinophils Absolute: 0.1 10*3/uL (ref 0.0–0.7)
Eosinophils Relative: 2 %
HCT: 40.5 % (ref 36.0–46.0)
Hemoglobin: 13 g/dL (ref 12.0–15.0)
Lymphocytes Relative: 14 %
Lymphs Abs: 1 10*3/uL (ref 0.7–4.0)
MCH: 25.5 pg — ABNORMAL LOW (ref 26.0–34.0)
MCHC: 32.1 g/dL (ref 30.0–36.0)
MCV: 79.4 fL (ref 78.0–100.0)
Monocytes Absolute: 0.6 10*3/uL (ref 0.1–1.0)
Monocytes Relative: 8 %
Neutro Abs: 5.4 10*3/uL (ref 1.7–7.7)
Neutrophils Relative %: 76 %
Platelets: 273 10*3/uL (ref 150–400)
RBC: 5.1 MIL/uL (ref 3.87–5.11)
RDW: 14.9 % (ref 11.5–15.5)
WBC: 7.1 10*3/uL (ref 4.0–10.5)

## 2015-04-25 LAB — URINALYSIS, ROUTINE W REFLEX MICROSCOPIC
Bilirubin Urine: NEGATIVE
Glucose, UA: NEGATIVE mg/dL
Hgb urine dipstick: NEGATIVE
Ketones, ur: NEGATIVE mg/dL
Leukocytes, UA: NEGATIVE
Nitrite: NEGATIVE
Protein, ur: NEGATIVE mg/dL
Specific Gravity, Urine: 1.028 (ref 1.005–1.030)
pH: 5.5 (ref 5.0–8.0)

## 2015-04-25 LAB — LIPASE, BLOOD: Lipase: 37 U/L (ref 11–51)

## 2015-04-25 LAB — I-STAT BETA HCG BLOOD, ED (MC, WL, AP ONLY): I-stat hCG, quantitative: <5 m[IU]/mL (ref <5)

## 2015-04-25 MED ORDER — SODIUM CHLORIDE 0.9 % IV BOLUS (SEPSIS)
1000.0000 mL | Freq: Once | INTRAVENOUS | Status: AC
  Administered 2015-04-25: 1000 mL via INTRAVENOUS

## 2015-04-25 MED ORDER — ONDANSETRON HCL 4 MG/2ML IJ SOLN
4.0000 mg | Freq: Once | INTRAMUSCULAR | Status: AC
  Administered 2015-04-25: 4 mg via INTRAVENOUS
  Filled 2015-04-25: qty 2

## 2015-04-25 MED ORDER — ONDANSETRON 4 MG PO TBDP: 4.0000 mg | ORAL_TABLET | Freq: Three times a day (TID) | ORAL | Status: DC | PRN

## 2015-04-25 MED ORDER — DICYCLOMINE HCL 20 MG PO TABS
20.0000 mg | ORAL_TABLET | Freq: Two times a day (BID) | ORAL | Status: DC
Start: 2015-04-25 — End: 2015-05-05

## 2015-04-25 MED ORDER — ACETAMINOPHEN 325 MG PO TABS
650.0000 mg | ORAL_TABLET | Freq: Once | ORAL | Status: AC
  Administered 2015-04-25: 650 mg via ORAL
  Filled 2015-04-25: qty 2

## 2015-04-25 MED ORDER — DICYCLOMINE HCL 10 MG PO CAPS
10.0000 mg | ORAL_CAPSULE | Freq: Once | ORAL | Status: AC
Start: 2015-04-25 — End: 2015-04-25
  Administered 2015-04-25: 10 mg via ORAL
  Filled 2015-04-25: qty 1

## 2015-04-25 NOTE — ED Provider Notes (Signed)
CSN: [id number]     Arrival date & time 04/25/15  2008 History   First MD Initiated Contact with Patient 04/25/15 2054     Chief Complaint  Patient presents with  . Emesis  . Diarrhea    HPI   Molly Ford is an 35 y.o. female with history of asthma who presents to the ED for evaluation of abdominal pain and N/V/D. She states her symptoms began last night. She reports diffuse abdominal pain that feels like "knots." Reports constant nausea with one episode of NBNB emesis overnight. Reports 3 episodes of watery non bloody diarrhea today. She states she feels weak and has body aches all over. She states she feels chills but is unsure if she has had a fever at home. Denies chest pain, SOB. Denies dysuria, urinary frequency/urgency. STates she has been able to tolerate PO though she feels nauseated. Denies recent travel. Denies sick contacts.  Past Medical History  Diagnosis Date  . Asthma    Past Surgical History  Procedure Laterality Date  . Cesarean section    . Back surgery     History reviewed. No pertinent family history. Social History  Substance Use Topics  . Smoking status: Never Smoker   . Smokeless tobacco: Never Used  . Alcohol Use: Yes     Comment: once a month   OB History    No data available     Review of Systems  All other systems reviewed and are negative.     Allergies  Review of patient's allergies indicates no known allergies.  Home Medications   Prior to Admission medications   Medication Sig Start Date End Date Taking? Authorizing Provider  amoxicillin (AMOXIL) 500 MG tablet Take 500 mg by mouth once.   Yes Historical Provider, MD  albuterol (PROVENTIL HFA;VENTOLIN HFA) 108 (90 BASE) MCG/ACT inhaler Inhale 2 puffs into the lungs every 4 (four) hours as needed for wheezing or shortness of breath.    Historical Provider, MD  HYDROcodone-acetaminophen (NORCO/VICODIN) 5-325 MG per tablet Take 1 tablet by mouth every 6 (six) hours as needed. Patient not  taking: Reported on 01/04/2015 04/25/14   Mellody Drown, PA-C  ibuprofen (ADVIL,MOTRIN) 200 MG tablet Take 200 mg by mouth every 6 (six) hours as needed for headache.    Historical Provider, MD  ibuprofen (ADVIL,MOTRIN) 800 MG tablet Take 1 tablet (800 mg total) by mouth 3 (three) times daily. Patient not taking: Reported on 02/07/2015 01/04/15   Urban Gibson, MD  traMADol (ULTRAM) 50 MG tablet Take 1 tablet (50 mg total) by mouth every 6 (six) hours as needed. Patient not taking: Reported on 02/07/2015 01/08/15   Marlon Pel, PA-C   BP 124/84 mmHg  Pulse 112  Temp(Src) 99.6 F (37.6 C) (Oral)  Resp 18  Ht  (1.626 m)  Wt 115.214 kg  BMI 43.58 kg/m2  SpO2 100%  LMP 03/28/2015 Physical Exam  Constitutional: She is oriented to person, place, and time. No distress.  HENT:  Right Ear: External ear normal.  Left Ear: External ear normal.  Nose: Nose normal.  Mouth/Throat: Oropharynx is clear and moist. No oropharyngeal exudate.  Eyes: Conjunctivae and EOM are normal. Pupils are equal, round, and reactive to light.  Neck: Normal range of motion. Neck supple.  Cardiovascular: Regular rhythm, normal heart sounds and intact distal pulses.  Tachycardia present.   Pulmonary/Chest: Effort normal and breath sounds normal. No respiratory distress. She has no wheezes. She exhibits no tenderness.  Abdominal: Soft. Bowel sounds  are normal. She exhibits no distension. There is no tenderness. There is no rigidity, no rebound, no guarding and no CVA tenderness.  Musculoskeletal: She exhibits no edema.  Neurological: She is alert and oriented to person, place, and time. No cranial nerve deficit.  Skin: Skin is warm and dry. She is not diaphoretic. There is pallor.  Psychiatric: She has a normal mood and affect.  Nursing note and vitals reviewed.  Filed Vitals:   04/25/15 2020 04/25/15 2240  BP: 124/84 140/82  Pulse: 112 84  Temp: 99.6 F (37.6 C) 98.5 F (36.9 C)  TempSrc: Oral Oral  Resp:  18 18  Height:  (1.626 m)   Weight: 115.214 kg   SpO2: 100% 100%     ED Course  Procedures (including critical care time) Labs Review Labs Reviewed  COMPREHENSIVE METABOLIC PANEL - Abnormal; Notable for the following:    Glucose, Bld 105 (*)    Calcium 8.6 (*)    All other components within normal limits  CBC WITH DIFFERENTIAL/PLATELET - Abnormal; Notable for the following:    MCH 25.5 (*)    All other components within normal limits  URINALYSIS, ROUTINE W REFLEX MICROSCOPIC (NOT AT [facility]) - Abnormal; Notable for the following:    APPearance CLOUDY (*)    All other components within normal limits  LIPASE, BLOOD  I-STAT BETA HCG BLOOD, ED ([facility], [facility], [facility] ONLY)    Imaging Review No results found. I have personally reviewed and evaluated these images and lab results as part of my medical decision-making.   EKG Interpretation None      MDM   Final diagnoses:  Generalized abdominal pain  Non-intractable vomiting with nausea, vomiting of unspecified type  Diarrhea, unspecified type    Nonfocal exam. Initial tachycardia resolved with 1L NS bolus, zofran, and bentyl. Pt able to tolerate PO in the ED. Labs unremarkable. She is otherwise nontoxic and stable. I suspect viral gastroenteritis. Rx given for zofran and bentyl. Encouraged plenty of fluids and BRAT diet. Resource guide given to establish PCP for f/u. ER return precautions given.    Carlene Coria, PA-C 04/25/15 2329  Melene Plan, DO 04/25/15 [phone]

## 2015-04-25 NOTE — ED Notes (Signed)
Pt aware that a urine sample is needed. Pt sts she is unable to go at this time.

## 2015-04-25 NOTE — Discharge Instructions (Signed)
Your labs today were normal. Your symptoms are likely due to a virus. I will give you a prescription for nausea medicine and a medicine for abdominal pain. Drink plenty of fluids to stay hydrated.  Take medications as prescribed. Return to the emergency room for worsening condition or new concerning symptoms. Follow up with your regular doctor. If you don't have a regular doctor use one of the numbers below to establish a primary care doctor.   Emergency Department Resource Guide 1) Find a Doctor and Pay Out of Pocket Although you won't have to find out who is covered by your insurance plan, it is a good idea to ask around and get recommendations. You will then need to call the office and see if the doctor you have chosen will accept you as a new patient and what types of options they offer for patients who are self-pay. Some doctors offer discounts or will set up payment plans for their patients who do not have insurance, but you will need to ask so you aren't surprised when you get to your appointment.  2) Contact Your Local Health Department Not all health departments have doctors that can see patients for sick visits, but many do, so it is worth a call to see if yours does. If you don't know where your local health department is, you can check in your phone book. The CDC also has a tool to help you locate your state's health department, and many state websites also have listings of all of their local health departments.  3) Find a Walk-in Clinic If your illness is not likely to be very severe or complicated, you may want to try a walk in clinic. These are popping up all over the country in pharmacies, drugstores, and shopping centers. They're usually staffed by nurse practitioners or physician assistants that have been trained to treat common illnesses and complaints. They're usually fairly quick and inexpensive. However, if you have serious medical issues or chronic medical problems, these are  probably not your best option.  No Primary Care Doctor: - Call Health Connect at  [phone] - they can help you locate a primary care doctor that  accepts your insurance, provides certain services, etc. - Physician Referral [phone]  Emergency Department Resource Guide 1) Find a Doctor and Pay Out of Pocket Although you won't have to find out who is covered by your insurance plan, it is a good idea to ask around and get recommendations. You will then need to call the office and see if the doctor you have chosen will accept you as a new patient and what types of options they offer for patients who are self-pay. Some doctors offer discounts or will set up payment plans for their patients who do not have insurance, but you will need to ask so you aren't surprised when you get to your appointment.  2) Contact Your Local Health Department Not all health departments have doctors that can see patients for sick visits, but many do, so it is worth a call to see if yours does. If you don't know where your local health department is, you can check in your phone book. The CDC also has a tool to help you locate your state's health department, and many state websites also have listings of all of their local health departments.  3) Find a Walk-in Clinic If your illness is not likely to be very severe or complicated, you may want to try a walk in clinic.  These are popping up all over the country in pharmacies, drugstores, and shopping centers. They're usually staffed by nurse practitioners or physician assistants that have been trained to treat common illnesses and complaints. They're usually fairly quick and inexpensive. However, if you have serious medical issues or chronic medical problems, these are probably not your best option.  No Primary Care Doctor: - Call Health Connect at  [phone] - they can help you locate a primary care doctor that  accepts your insurance, provides certain services,  etc. - Physician Referral Service- [phone]  Chronic Pain Problems: Organization         Address  Phone   Notes  [facility]  [phone] Patients need to be referred by their primary care doctor.   Medication Assistance: Organization         Address  Phone   Notes  [facility] Medication [facility] [address] [place] [place], [place] [address] [phone] --Must be a resident of [facility] -- Must have NO insurance coverage whatsoever (no Medicaid/ Medicare, etc.) -- The pt. MUST have a primary care doctor that directs their care regularly and follows them in the community   [facility]  [phone]   [employer]  [phone]    Agencies that provide inexpensive medical care: Organization         Address  Phone   Notes  [facility]  [phone]   [facility]    [phone]   [facility] [address] [place], [place] [address] [phone]   [facility] [address], [place] [phone]   [facility]    [phone]   [facility]    [phone]   [facility]  [address], [place] Phone:  [phone], Fax:  [phone] Hours of Operation:  9 am - 6 pm, M-F.  Also accepts Medicaid/Medicare and self-pay.  [facility]  [address], [place] Phone: [phone], Fax: [phone]. Hours of Operation:  8:30 am - 5:30 pm, M-F.  Also accepts Medicaid and self-pay.  [facility] [place] [address], [place] Phone: [phone]   [facility] [address] Natasha Bence [place], [place] [phone], Ext. [phone] Mondays & Thursdays: 7-9 AM.  First 15 patients are seen on a first come, first serve basis.    Medicaid-accepting [facility] Providers:  Organization         Address  Phone   Notes  [facility] [address], [place] [phone] Also accepts self-pay patients.  [facility] [address] Laurell Josephs [place], [place]  [phone]   [facility] [address], [place] [phone]   [facility] [address] [place] [phone]   Renaye Rakers [address], [place]   [phone] Only accepts [place] patients after they have their name applied to their card.   Self-Pay (no insurance) in [facility]:  Organization         Address  Phone   Notes  Sickle Cell Patients, [facility] [address] [place], [place] [phone]   [facility] [address] [place], [place] [phone]   [facility]  [place]  [address], [place] [phone]   [facility]/Dr. Osei-Bonsu  [address] [place] or [address], [place] [phone] Phone number for both [place] and [place] locations is the same.  [facility] [address], [place] [phone]   [facility] [address], [place] or [address] [phone]   [facility] [address] [place] [phone], phone; [phone], fax Sees patients 1st and 3rd Saturday of every month.  Must not qualify for public or private insurance (i.e. Medicaid, Medicare, [facility], Veterans' Benefits)  Household income should be no more than 200% of the poverty level The clinic cannot treat you if you are pregnant or think you are pregnant  Sexually transmitted diseases are not treated at the clinic.

## 2015-04-25 NOTE — ED Notes (Signed)
Pt complains of vomiting, diarrhea and general body aches since yesterday

## 2015-05-05 ENCOUNTER — Ambulatory Visit (INDEPENDENT_AMBULATORY_CARE_PROVIDER_SITE_OTHER): Payer: 59

## 2015-05-05 ENCOUNTER — Ambulatory Visit (INDEPENDENT_AMBULATORY_CARE_PROVIDER_SITE_OTHER): Payer: 59 | Admitting: Emergency Medicine

## 2015-05-05 VITALS — BP 124/80 | HR 90 | Temp 100.5°F | Resp 17 | Ht 64.0 in | Wt 256.0 lb

## 2015-05-05 DIAGNOSIS — J101 Influenza due to other identified influenza virus with other respiratory manifestations: Secondary | ICD-10-CM

## 2015-05-05 DIAGNOSIS — R509 Fever, unspecified: Secondary | ICD-10-CM

## 2015-05-05 DIAGNOSIS — R05 Cough: Secondary | ICD-10-CM

## 2015-05-05 DIAGNOSIS — M791 Myalgia, unspecified site: Secondary | ICD-10-CM

## 2015-05-05 DIAGNOSIS — R059 Cough, unspecified: Secondary | ICD-10-CM

## 2015-05-05 LAB — POCT CBC
Granulocyte percent: 64.5 %G (ref 37–80)
HCT, POC: 38 % (ref 37.7–47.9)
Hemoglobin: 12.5 g/dL (ref 12.2–16.2)
Lymph, poc: 1.6 (ref 0.6–3.4)
MCH, POC: 25.3 pg — AB (ref 27–31.2)
MCHC: 33 g/dL (ref 31.8–35.4)
MCV: 76.8 fL — AB (ref 80–97)
MID (cbc): 0.4 (ref 0–0.9)
MPV: 8.2 fL (ref 0–99.8)
POC Granulocyte: 3.6 (ref 2–6.9)
POC LYMPH PERCENT: 28.3 %L (ref 10–50)
POC MID %: 7.2 %M (ref 0–12)
Platelet Count, POC: 257 10*3/uL (ref 142–424)
RBC: 4.96 M/uL (ref 4.04–5.48)
RDW, POC: 15.5 %
WBC: 5.6 10*3/uL (ref 4.6–10.2)

## 2015-05-05 LAB — POCT INFLUENZA A/B
Influenza A, POC: POSITIVE — AB
Influenza B, POC: NEGATIVE

## 2015-05-05 MED ORDER — BENZONATATE 100 MG PO CAPS: 100.0000 mg | ORAL_CAPSULE | Freq: Three times a day (TID) | ORAL | Status: DC | PRN

## 2015-05-05 MED ORDER — OSELTAMIVIR PHOSPHATE 75 MG PO CAPS: 75.0000 mg | ORAL_CAPSULE | Freq: Two times a day (BID) | ORAL | Status: DC

## 2015-05-05 NOTE — Patient Instructions (Addendum)
Influenza, Adult Influenza ("the flu") is a viral infection of the respiratory tract. It occurs more often in winter months because people spend more time in close contact with one another. Influenza can make you feel very sick. Influenza easily spreads from person to person (contagious). CAUSES  Influenza is caused by a virus that infects the respiratory tract. You can catch the virus by breathing in droplets from an infected person's cough or sneeze. You can also catch the virus by touching something that was recently contaminated with the virus and then touching your mouth, nose, or eyes. RISKS AND COMPLICATIONS You may be at risk for a more severe case of influenza if you smoke cigarettes, have diabetes, have chronic heart disease (such as heart failure) or lung disease (such as asthma), or if you have a weakened immune system. Elderly people and pregnant women are also at risk for more serious infections. The most common problem of influenza is a lung infection (pneumonia). Sometimes, this problem can require emergency medical care and may be life threatening. SIGNS AND SYMPTOMS  Symptoms typically last 4 to 10 days and may include:  Fever.  Chills.  Headache, body aches, and muscle aches.  Sore throat.  Chest discomfort and cough.  Poor appetite.  Weakness or feeling tired.  Dizziness.  Nausea or vomiting. DIAGNOSIS  Diagnosis of influenza is often made based on your history and a physical exam. A nose or throat swab test can be done to confirm the diagnosis. TREATMENT  In mild cases, influenza goes away on its own. Treatment is directed at relieving symptoms. For more severe cases, your health care provider may prescribe antiviral medicines to shorten the sickness. Antibiotic medicines are not effective because the infection is caused by a virus, not by bacteria. HOME CARE INSTRUCTIONS  Take medicines only as directed by your health care provider.  Use a cool mist humidifier  to make breathing easier.  Get plenty of rest until your temperature returns to normal. This usually takes 3 to 4 days.  Drink enough fluid to keep your urine clear or pale yellow.  Cover yourmouth and nosewhen coughing or sneezing,and wash your handswellto prevent thevirusfrom spreading.  Stay homefromwork orschool untilthe fever is gonefor at least 55full day. PREVENTION  An annual influenza vaccination (flu shot) is the best way to avoid getting influenza. An annual flu shot is now routinely recommended for all adults in the [place] SEEK MEDICAL CARE IF:  You experiencechest pain, yourcough worsens,or you producemore mucus.  Youhave nausea,vomiting, ordiarrhea.  Your fever returns or gets worse. SEEK IMMEDIATE MEDICAL CARE IF:  You havetrouble breathing, you become short of breath,or your skin ornails becomebluish.  You have severe painor stiffnessin the neck.  You develop a sudden headache, or pain in the face or ear.  You have nausea or vomiting that you cannot control. MAKE SURE YOU:   Understand these instructions.  Will watch your condition.  Will get help right away if you are not doing well or get worse.   This information is not intended to replace advice given to you by your health care provider. Make sure you discuss any questions you have with your health care provider.      Because you received an x-ray today, you will receive an invoice from [facility]. Please contact [facility] at [phone] with questions or concerns regarding your invoice. Our billing staff will not be able to assist you with those questions.    Document Released: 03/08/2000 Document Revised:  04/01/2014 Document Reviewed: 06/10/2011 Elsevier Interactive Patient Education [employer].

## 2015-05-05 NOTE — Progress Notes (Addendum)
By signing my name below, I, Stann Ore, attest that this documentation has been prepared under the direction and in the presence of Lesle Chris, MD. Electronically Signed: Stann Ore, Scribe. 05/05/2015 , 3:20 PM .  Patient was seen in room 13 .  Chief Complaint:  Chief Complaint  Patient presents with  . Knee Pain  . difficulty standing    HPI: Molly Ford is a 35 y.o. female who [facility] to [facility] complaining of myalgia.  She went to the hospital for norovirus on 04/26/2015. After she left, she's been wheezing, having shortness of breath and myalgia. Today, she can hardly walk and feels pain in her knees when she's walking. She also reports having chills and hard to breathe. She mentions a coworker also has norovirus.   She's in the clinic with her partner today. She denies possible chance of pregnancy.   Past Medical History  Diagnosis Date  . Asthma    Past Surgical History  Procedure Laterality Date  . Cesarean section    . Back surgery     Social History   Social History  . Marital Status: Single    Spouse Name: N/A  . Number of Children: N/A  . Years of Education: N/A   Social History Main Topics  . Smoking status: Never Smoker   . Smokeless tobacco: Never Used  . Alcohol Use: Yes     Comment: once a month  . Drug Use: No  . Sexual Activity: Yes    Birth Control/ Protection: None   Other Topics Concern  . None   Social History Narrative   No family history on file. No Known Allergies Prior to Admission medications   Medication Sig Start Date End Date Taking? Authorizing Provider  albuterol (PROVENTIL HFA;VENTOLIN HFA) 108 (90 BASE) MCG/ACT inhaler Inhale 2 puffs into the lungs every 4 (four) hours as needed for wheezing or shortness of breath.   Yes Historical Provider, MD     ROS:  Constitutional: negative for night sweats, weight changes, or fatigue; positive for fever, chills HEENT: negative for vision changes, hearing loss,  congestion, rhinorrhea, ST, epistaxis, or sinus pressure Cardiovascular: negative for chest pain or palpitations Respiratory: negative for hemoptysis, or cough; positive for shortness of breath, wheezing Abdominal: negative for abdominal pain, nausea, vomiting, diarrhea, or constipation Dermatological: negative for rash Musc: positive for myalgia Neurologic: negative for headache, dizziness, or syncope All other systems reviewed and are otherwise negative with the exception to those above and in the HPI.  PHYSICAL EXAM: Filed Vitals:   05/05/15 1501  BP: 124/80  Pulse: 90  Temp: 100.5 F (38.1 C)  Resp: 17   Body mass index is 43.92 kg/(m^2).   General: Alert, no acute distress, ill, not toxic HEENT:  Normocephalic, atraumatic, oropharynx patent; throat not red, significant nasal congestion Eye: EOMI, PEERLDC Cardiovascular:  Regular rate and rhythm, no rubs murmurs or gallops.  No Carotid bruits, radial pulse intact. No pedal edema.  Respiratory: Clear to auscultation bilaterally.  No wheezes, rales, or rhonchi.  No cyanosis, no use of accessory musculature Abdominal: No organomegaly, abdomen is soft and non-tender, positive bowel sounds. No masses. Musculoskeletal: Gait intact. No edema, tenderness Skin: No rashes. Neurologic: Facial musculature symmetric. Psychiatric: Patient acts appropriately throughout our interaction.  Lymphatic: No cervical or submandibular lymphadenopathy Genitourinary/Anorectal: No acute findings  LABS: Results for orders placed or performed in visit on 05/05/15  POCT CBC  Result Value Ref Range   WBC 5.6 4.6 -  10.2 K/uL   Lymph, poc 1.6 0.6 - 3.4   POC LYMPH PERCENT 28.3 10 - 50 %L   MID (cbc) 0.4 0 - 0.9   POC MID % 7.2 0 - 12 %M   POC Granulocyte 3.6 2 - 6.9   Granulocyte percent 64.5 37 - 80 %G   RBC 4.96 4.04 - 5.48 M/uL   Hemoglobin 12.5 12.2 - 16.2 g/dL   HCT, POC 16.1 09.6 - 47.9 %   MCV 76.8 (A) 80 - 97 fL   MCH, POC 25.3 (A) 27 -  31.2 pg   MCHC 33.0 31.8 - 35.4 g/dL   RDW, POC 04.5 %   Platelet Count, POC 257 142 - 424 K/uL   MPV 8.2 0 - 99.8 fL  POCT Influenza A/B  Result Value Ref Range   Influenza A, POC Positive (A) Negative   Influenza B, POC Negative Negative    EKG/XRAY:   Primary read interpreted by Dr. Cleta Alberts No results found. No results found.  ASSESSMENT/PLAN: Patient tested positive for flu a. Will treat with Tamiflu. Given note out of work. I personally performed the services described in this documentation, which was scribed in my presence. The recorded information has been reviewed and is accurate.  Patient's partner, Molly Ford, to take tamiflu 1 pill daily to prevent influenza.   Gross sideeffects, risk and benefits, and alternatives of medications d/w patient. Patient is aware that all medications have potential sideeffects and we are unable to predict every sideeffect or drug-drug interaction that may occur.  Lesle Chris MD 05/05/2015 4:11 PM

## 2016-01-01 ENCOUNTER — Emergency Department (HOSPITAL_COMMUNITY)
Admission: EM | Admit: 2016-01-01 | Discharge: 2016-01-01 | Disposition: A | Discharge status: Home / Self Care | Payer: Medicaid Other | Attending: Emergency Medicine | Admitting: Emergency Medicine

## 2016-01-01 ENCOUNTER — Encounter (HOSPITAL_COMMUNITY): Payer: Self-pay | Admitting: Emergency Medicine

## 2016-01-01 DIAGNOSIS — R05 Cough: Secondary | ICD-10-CM | POA: Insufficient documentation

## 2016-01-01 DIAGNOSIS — J45909 Unspecified asthma, uncomplicated: Secondary | ICD-10-CM | POA: Insufficient documentation

## 2016-01-01 DIAGNOSIS — R109 Unspecified abdominal pain: Secondary | ICD-10-CM | POA: Insufficient documentation

## 2016-01-01 DIAGNOSIS — Z5321 Procedure and treatment not carried out due to patient leaving prior to being seen by health care provider: Secondary | ICD-10-CM | POA: Insufficient documentation

## 2016-01-01 LAB — COMPREHENSIVE METABOLIC PANEL
ALT: 21 U/L (ref 14–54)
AST: 15 U/L (ref 15–41)
Albumin: 4.3 g/dL (ref 3.5–5.0)
Alkaline Phosphatase: 73 U/L (ref 38–126)
Anion gap: 6 (ref 5–15)
BUN: 11 mg/dL (ref 6–20)
CO2: 23 mmol/L (ref 22–32)
Calcium: 8.8 mg/dL — ABNORMAL LOW (ref 8.9–10.3)
Chloride: 108 mmol/L (ref 101–111)
Creatinine, Ser: 0.75 mg/dL (ref 0.44–1.00)
GFR calc Af Amer: >60 mL/min (ref >60)
GFR calc non Af Amer: >60 mL/min (ref >60)
Glucose, Bld: 86 mg/dL (ref 65–99)
Potassium: 3.4 mmol/L — ABNORMAL LOW (ref 3.5–5.1)
Sodium: 137 mmol/L (ref 135–145)
Total Bilirubin: 0.4 mg/dL (ref 0.3–1.2)
Total Protein: 8 g/dL (ref 6.5–8.1)

## 2016-01-01 LAB — CBC
HCT: 38.4 % (ref 36.0–46.0)
Hemoglobin: 12.3 g/dL (ref 12.0–15.0)
MCH: 25.1 pg — ABNORMAL LOW (ref 26.0–34.0)
MCHC: 32 g/dL (ref 30.0–36.0)
MCV: 78.2 fL (ref 78.0–100.0)
Platelets: 298 10*3/uL (ref 150–400)
RBC: 4.91 MIL/uL (ref 3.87–5.11)
RDW: 15.4 % (ref 11.5–15.5)
WBC: 10.3 10*3/uL (ref 4.0–10.5)

## 2016-01-01 LAB — LIPASE, BLOOD: Lipase: 24 U/L (ref 11–51)

## 2016-01-01 LAB — I-STAT BETA HCG BLOOD, ED (MC, WL, AP ONLY): I-stat hCG, quantitative: <5 m[IU]/mL (ref <5)

## 2016-01-01 NOTE — ED Notes (Signed)
Patient called three times for vitals recheck,lobby,bathrooms,parking area all checked with NO answer.

## 2016-01-01 NOTE — ED Triage Notes (Signed)
Patient c/o cough x week and states she feels like she did when she had the flu earlier this year. Patient also c/o of "feeling bigger on my right side, like my large intestines is moving".

## 2016-01-01 NOTE — ED Notes (Signed)
Patient called twice for vitals recheck @ 1hr 40 min mark with no answer.

## 2016-05-10 ENCOUNTER — Other Ambulatory Visit (HOSPITAL_COMMUNITY): Payer: Self-pay | Admitting: Sports Medicine

## 2016-05-10 DIAGNOSIS — M25562 Pain in left knee: Secondary | ICD-10-CM

## 2016-05-15 ENCOUNTER — Ambulatory Visit (HOSPITAL_COMMUNITY)
Admission: RE | Admit: 2016-05-15 | Discharge: 2016-05-15 | Disposition: A | Discharge status: Home / Self Care | Payer: Medicaid Other | Source: Ambulatory Visit | Attending: Sports Medicine | Admitting: Sports Medicine

## 2016-05-15 DIAGNOSIS — M25562 Pain in left knee: Secondary | ICD-10-CM

## 2016-05-15 DIAGNOSIS — M222X2 Patellofemoral disorders, left knee: Secondary | ICD-10-CM | POA: Insufficient documentation

## 2016-08-23 ENCOUNTER — Ambulatory Visit: Payer: 59 | Admitting: Family Medicine

## 2016-08-29 ENCOUNTER — Emergency Department (HOSPITAL_COMMUNITY)
Admission: EM | Admit: 2016-08-29 | Discharge: 2016-08-29 | Disposition: A | Discharge status: Home / Self Care | Payer: PRIVATE HEALTH INSURANCE | Attending: Emergency Medicine | Admitting: Emergency Medicine

## 2016-08-29 ENCOUNTER — Encounter (HOSPITAL_COMMUNITY): Payer: Self-pay

## 2016-08-29 DIAGNOSIS — M62838 Other muscle spasm: Secondary | ICD-10-CM | POA: Insufficient documentation

## 2016-08-29 DIAGNOSIS — J45909 Unspecified asthma, uncomplicated: Secondary | ICD-10-CM | POA: Insufficient documentation

## 2016-08-29 DIAGNOSIS — M542 Cervicalgia: Secondary | ICD-10-CM | POA: Diagnosis present

## 2016-08-29 DIAGNOSIS — Z79899 Other long term (current) drug therapy: Secondary | ICD-10-CM | POA: Insufficient documentation

## 2016-08-29 MED ORDER — METHOCARBAMOL 500 MG PO TABS: 1000.0000 mg | ORAL_TABLET | Freq: Three times a day (TID) | ORAL | 0 refills | Status: DC | PRN

## 2016-08-29 MED ORDER — METHOCARBAMOL 500 MG PO TABS
1000.0000 mg | ORAL_TABLET | Freq: Once | ORAL | Status: AC
  Administered 2016-08-29: 1000 mg via ORAL
  Filled 2016-08-29: qty 2

## 2016-08-29 MED ORDER — IBUPROFEN 800 MG PO TABS
800.0000 mg | ORAL_TABLET | Freq: Once | ORAL | Status: AC
  Administered 2016-08-29: 800 mg via ORAL
  Filled 2016-08-29: qty 1

## 2016-08-29 MED ORDER — IBUPROFEN 800 MG PO TABS: 800.0000 mg | ORAL_TABLET | Freq: Three times a day (TID) | ORAL | 0 refills | Status: DC | PRN

## 2016-08-29 NOTE — ED Provider Notes (Signed)
By signing my name below, I, Molly Ford, attest that this documentation has been prepared under the direction and in the presence of [name], [name], DO. Electronically Signed: [place] Ford, Scribe. 08/29/2016. 1:39 AM.  TIME SEEN: 1:39 AM  CHIEF COMPLAINT: Neck pain  HPI:  HPI Comments: Molly Ford is a 36 y.o. female who presents to the Emergency Department complaining of constant moderate neck pain that started today. She states she woke up with neck pain this morning. No known trauma. She used Goody's powder, Tylenol extra strength, and a heating pad PTA with minimal relief. Her pain is exacerbated by neck rotation. Denies any fever, bowel/bladder incontinence, urinary retention, numbness, tingling or focal weakness, gait problem, or other complaints at this time.   ROS: See HPI Constitutional: no fever  Eyes: no drainage  ENT: no runny nose   Cardiovascular:  no chest pain  Resp: no SOB  GI: no vomiting GU: no dysuria Integumentary: no rash  Allergy: no hives  Musculoskeletal: +neck pain, no gait problem, no leg swelling  Neurological: no slurred speech ROS otherwise negative  PAST MEDICAL HISTORY/PAST SURGICAL HISTORY:  Past Medical History:  Diagnosis Date  . Asthma     MEDICATIONS:  Prior to Admission medications   Medication Sig Start Date End Date Taking? Authorizing Provider  albuterol (PROVENTIL HFA;VENTOLIN HFA) 108 (90 BASE) MCG/ACT inhaler Inhale 2 puffs into the lungs every 4 (four) hours as needed for wheezing or shortness of breath.    [provider]  benzonatate (TESSALON) 100 MG capsule Take 1-2 capsules (100-200 mg total) by mouth 3 (three) times daily as needed for cough. 05/05/15   Collene Gobble, MD  oseltamivir (TAMIFLU) 75 MG capsule Take 1 capsule (75 mg total) by mouth 2 (two) times daily. 05/05/15   Collene Gobble, MD    ALLERGIES:  No Known Allergies  SOCIAL HISTORY:  Social History  Substance Use Topics  . Smoking status: Never  Smoker  . Smokeless tobacco: Never Used  . Alcohol use Yes     Comment: once a month    FAMILY HISTORY: History reviewed. No pertinent family history.  EXAM: BP (!) 142/95 (BP Location: Left Arm)   Pulse 89   Temp 98.2 F (36.8 C) (Oral)   Resp 16   LMP 08/21/2016   SpO2 99%  CONSTITUTIONAL: Alert and oriented and responds appropriately to questions. Well-appearing; well-nourished HEAD: Normocephalic EYES: Conjunctivae clear, pupils appear equal, EOMI ENT: normal nose; moist mucous membranes NECK: Supple, no meningismus, no nuchal rigidity, no LAD, tender to palpation throughout the cervical paraspinal musculature but no midline spinal tenderness, step-off or deformity  CARD: RRR; S1 and S2 appreciated; no murmurs, no clicks, no rubs, no gallops RESP: Normal chest excursion without splinting or tachypnea; breath sounds clear and equal bilaterally; no wheezes, no rhonchi, no rales, no hypoxia or respiratory distress, speaking full sentences ABD/GI: Normal bowel sounds; non-distended; soft, non-tender, no rebound, no guarding, no peritoneal signs, no hepatosplenomegaly BACK:  The back appears normal and is non-tender to palpation, there is no CVA tenderness EXT: Normal ROM in all joints; non-tender to palpation; no edema; normal capillary refill; no cyanosis, no calf tenderness or swelling    SKIN: Normal color for age and race; warm; no rash NEURO: Moves all extremities equally, normal gait, sensation to light touch intact diffusely, strength 5/5 in all 4 extremities, no saddle anesthesia, 2+ deep tendon reflexes in bilateral upper and lower cavities, no clonus PSYCH: The patient's mood  and manner are appropriate. Grooming and personal hygiene are appropriate.  MEDICAL DECISION MAKING: Patient here with symptoms of acute torticollis. No signs or symptoms to suggest cervical myelopathy, stenosis, epidural abscess or hematoma, discitis, transverse myelitis, demyelinating disorder,  fracture, cauda equina. We'll treat with anti-inflammatories and muscle relaxers. Have recommended stretching, alternating heat and ice. We'll provide work note. I do not feel she needs emergent imaging of her neck and she is comfortable with this plan. Doubt meningitis. No radicular symptoms.  At this time, I do not feel there is any life-threatening condition present. I have reviewed and discussed all results (EKG, imaging, lab, urine as appropriate) and exam findings with patient/family. I have reviewed nursing notes and appropriate previous records.  I feel the patient is safe to be discharged home without further emergent workup and can continue workup as an outpatient as needed. Discussed usual and customary return precautions. Patient/family verbalize understanding and are comfortable with this plan.  Outpatient follow-up has been provided if needed. All questions have been answered.  I personally performed the services described in this documentation, which was scribed in my presence. The recorded information has been reviewed and is accurate.    [name], Layla MawKristen N, DO 08/29/16 [phone]

## 2016-08-29 NOTE — Discharge Instructions (Signed)
You may alternate Tylenol 1000 mg every 6 hours as needed for pain and Ibuprofen 800 mg every 8 hours as needed for pain.  Please take Ibuprofen with food.  I recommend that you avoid Goody Powders.     To find a primary care or specialty doctor please call [phone] or [phone] to access "[place] Find a Doctor Service."  You may also go on the [facility] website at [email or web address]  There are also multiple [facility], [facility], [facility] and [facility] practices throughout [place] that are frequently accepting new patients. You may find a clinic that is close to your home and contact them.  [facility] -  [address] [place] [phone]   [facility] -  [address] [place] [id number] [phone]   [facility] [phone]  [place] [phone]

## 2017-09-13 ENCOUNTER — Encounter (HOSPITAL_COMMUNITY): Payer: Self-pay | Admitting: Emergency Medicine

## 2017-09-13 ENCOUNTER — Emergency Department (HOSPITAL_COMMUNITY): Payer: Self-pay

## 2017-09-13 ENCOUNTER — Emergency Department (HOSPITAL_COMMUNITY)
Admission: EM | Admit: 2017-09-13 | Discharge: 2017-09-13 | Disposition: A | Discharge status: Home / Self Care | Payer: Self-pay | Attending: Emergency Medicine | Admitting: Emergency Medicine

## 2017-09-13 DIAGNOSIS — J45909 Unspecified asthma, uncomplicated: Secondary | ICD-10-CM | POA: Insufficient documentation

## 2017-09-13 DIAGNOSIS — B9689 Other specified bacterial agents as the cause of diseases classified elsewhere: Secondary | ICD-10-CM

## 2017-09-13 DIAGNOSIS — R1032 Left lower quadrant pain: Secondary | ICD-10-CM | POA: Insufficient documentation

## 2017-09-13 DIAGNOSIS — N760 Acute vaginitis: Secondary | ICD-10-CM | POA: Insufficient documentation

## 2017-09-13 DIAGNOSIS — R109 Unspecified abdominal pain: Secondary | ICD-10-CM

## 2017-09-13 LAB — CBC
HCT: 41.2 % (ref 36.0–46.0)
Hemoglobin: 12.6 g/dL (ref 12.0–15.0)
MCH: 25 pg — ABNORMAL LOW (ref 26.0–34.0)
MCHC: 30.6 g/dL (ref 30.0–36.0)
MCV: 81.7 fL (ref 78.0–100.0)
Platelets: 287 10*3/uL (ref 150–400)
RBC: 5.04 MIL/uL (ref 3.87–5.11)
RDW: 15.5 % (ref 11.5–15.5)
WBC: 14.9 10*3/uL — ABNORMAL HIGH (ref 4.0–10.5)

## 2017-09-13 LAB — LIPASE, BLOOD: Lipase: 30 U/L (ref 11–51)

## 2017-09-13 LAB — URINALYSIS, ROUTINE W REFLEX MICROSCOPIC
Bilirubin Urine: NEGATIVE
Glucose, UA: NEGATIVE mg/dL
Hgb urine dipstick: NEGATIVE
Ketones, ur: NEGATIVE mg/dL
Leukocytes, UA: NEGATIVE
Nitrite: NEGATIVE
Protein, ur: NEGATIVE mg/dL
Specific Gravity, Urine: 1.017 (ref 1.005–1.030)
pH: 6 (ref 5.0–8.0)

## 2017-09-13 LAB — I-STAT BETA HCG BLOOD, ED (MC, WL, AP ONLY): I-stat hCG, quantitative: <5 m[IU]/mL (ref <5)

## 2017-09-13 LAB — WET PREP, GENITAL
Sperm: NONE SEEN
Trich, Wet Prep: NONE SEEN
Yeast Wet Prep HPF POC: NONE SEEN

## 2017-09-13 LAB — COMPREHENSIVE METABOLIC PANEL
ALT: 21 U/L (ref 14–54)
AST: 14 U/L — ABNORMAL LOW (ref 15–41)
Albumin: 3.7 g/dL (ref 3.5–5.0)
Alkaline Phosphatase: 86 U/L (ref 38–126)
Anion gap: 7 (ref 5–15)
BUN: 8 mg/dL (ref 6–20)
CO2: 23 mmol/L (ref 22–32)
Calcium: 9 mg/dL (ref 8.9–10.3)
Chloride: 107 mmol/L (ref 101–111)
Creatinine, Ser: 0.75 mg/dL (ref 0.44–1.00)
GFR calc Af Amer: >60 mL/min (ref >60)
GFR calc non Af Amer: >60 mL/min (ref >60)
Glucose, Bld: 102 mg/dL — ABNORMAL HIGH (ref 65–99)
Potassium: 4 mmol/L (ref 3.5–5.1)
Sodium: 137 mmol/L (ref 135–145)
Total Bilirubin: 1 mg/dL (ref 0.3–1.2)
Total Protein: 7.4 g/dL (ref 6.5–8.1)

## 2017-09-13 MED ORDER — CEFTRIAXONE SODIUM 250 MG IJ SOLR
250.0000 mg | Freq: Once | INTRAMUSCULAR | Status: AC
  Administered 2017-09-13: 250 mg via INTRAMUSCULAR
  Filled 2017-09-13: qty 250

## 2017-09-13 MED ORDER — IOHEXOL 300 MG/ML  SOLN
100.0000 mL | Freq: Once | INTRAMUSCULAR | Status: AC | PRN
  Administered 2017-09-13: 100 mL via INTRAVENOUS

## 2017-09-13 MED ORDER — METRONIDAZOLE 500 MG PO TABS: 500.0000 mg | ORAL_TABLET | Freq: Two times a day (BID) | ORAL | 0 refills | Status: DC

## 2017-09-13 MED ORDER — FENTANYL CITRATE (PF) 100 MCG/2ML IJ SOLN: 50.0000 ug | Freq: Once | INTRAMUSCULAR | Status: DC

## 2017-09-13 MED ORDER — HYDROMORPHONE HCL 1 MG/ML IJ SOLN: 1.0000 mg | Freq: Once | INTRAMUSCULAR | Status: DC

## 2017-09-13 MED ORDER — FENTANYL CITRATE (PF) 100 MCG/2ML IJ SOLN
50.0000 ug | Freq: Once | INTRAMUSCULAR | Status: AC
  Administered 2017-09-13: 50 ug via INTRAVENOUS
  Filled 2017-09-13: qty 2

## 2017-09-13 MED ORDER — DOXYCYCLINE HYCLATE 100 MG PO CAPS: 100.0000 mg | ORAL_CAPSULE | Freq: Two times a day (BID) | ORAL | 0 refills | Status: AC

## 2017-09-13 MED ORDER — ONDANSETRON HCL 4 MG/2ML IJ SOLN
4.0000 mg | Freq: Once | INTRAMUSCULAR | Status: AC
  Administered 2017-09-13: 4 mg via INTRAVENOUS
  Filled 2017-09-13: qty 2

## 2017-09-13 NOTE — ED Provider Notes (Signed)
[facility] EMERGENCY DEPARTMENT Provider Note   CSN: [id number] Arrival date & time: 09/13/17  1045     History   Chief Complaint Chief Complaint  Patient presents with  . Abdominal Pain    HPI Molly Ford is a 37 y.o. female who presents the emergency department chief complaint of abdominal pain.  Patient states that she had onset of left lower quadrant abdominal pain.  She states it has been worsening over the past 24 hours to the point where she has pain whenever she is tries to laugh talk or walk.  She states she has never had any pain this bad in her life.  She denies fevers or chills.  She states that she is due to get her.  Coming up in the next few days however this does not feel like menstrual cramping.  She denies constipation or diarrhea.  She has not had no nausea or vomiting.  HPI  Past Medical History:  Diagnosis Date  . Asthma     Patient Active Problem List   Diagnosis Date Noted  . ASTHMA, INTERMITTENT 11/25/2009  . PANIC ATTACK 03/21/2009  . ALLERGIC RHINITIS 06/24/2008  . HEADACHE 06/24/2008  . 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 HYPERTROPHY, TONSILS 12/26/2006    Past Surgical History:  Procedure Laterality Date  . BACK SURGERY    . CESAREAN SECTION       OB History   None      Home Medications    Prior to Admission medications   Medication Sig Start Date End Date Taking? Authorizing Provider  naproxen sodium (ALEVE) 220 MG tablet Take 440 mg by mouth daily as needed (Pain).    Yes [provider]  benzonatate (TESSALON) 100 MG capsule Take 1-2 capsules (100-200 mg total) by mouth 3 (three) times daily as needed for cough. Patient not taking: Reported on 09/13/2017 05/05/15   Collene Gobble, MD  ibuprofen (ADVIL,MOTRIN) 800 MG tablet Take 1 tablet (800 mg total) by mouth every 8 (eight) hours as needed for mild pain. Patient not taking: Reported on 09/13/2017 08/29/16   [name], [name], DO  methocarbamol (ROBAXIN) 500  MG tablet Take 2 tablets (1,000 mg total) by mouth every 8 (eight) hours as needed for muscle spasms. Patient not taking: Reported on 09/13/2017 08/29/16   [name], [name], DO  oseltamivir (TAMIFLU) 75 MG capsule Take 1 capsule (75 mg total) by mouth 2 (two) times daily. Patient not taking: Reported on 09/13/2017 05/05/15   Collene Gobble, MD    Family History No family history on file.  Social History Social History   Tobacco Use  . Smoking status: Never Smoker  . Smokeless tobacco: Never Used  Substance Use Topics  . Alcohol use: Yes    Comment: once a month  . Drug use: No     Allergies   Patient has no known allergies.   Review of Systems Review of Systems Ten systems reviewed and are negative for acute change, except as noted in the HPI.    Physical Exam Updated Vital Signs BP 126/82 (BP Location: Left Arm)   Pulse (!) 110   Temp 98.9 F (37.2 C) (Oral)   Resp 20   LMP 08/14/2017 (Approximate)   SpO2 98%   Physical Exam  Constitutional: She is oriented to person, place, and time. She appears well-developed and well-nourished. No distress.  HENT:  Head: Normocephalic and atraumatic.  Eyes: Conjunctivae are normal. No scleral icterus.  Neck: Normal  range of motion.  Cardiovascular: Normal rate, regular rhythm and normal heart sounds. Exam reveals no gallop and no friction rub.  No murmur heard. Pulmonary/Chest: Effort normal and breath sounds normal. No respiratory distress.  Abdominal: Soft. Normal appearance and bowel sounds are normal. She exhibits no distension and no mass. There is tenderness in the right lower quadrant and left lower quadrant. There is rebound. There is no rigidity and no guarding.    Neurological: She is alert and oriented to person, place, and time.  Skin: Skin is warm and dry. She is not diaphoretic.  Psychiatric: Her behavior is normal.  Nursing note and vitals reviewed.    ED Treatments / Results  Labs (all labs ordered are  listed, but only abnormal results are displayed) Labs Reviewed  COMPREHENSIVE METABOLIC PANEL - Abnormal; Notable for the following components:      Result Value   Glucose, Bld 102 (*)    AST 14 (*)    All other components within normal limits  CBC - Abnormal; Notable for the following components:   WBC 14.9 (*)    MCH 25.0 (*)    All other components within normal limits  WET PREP, GENITAL  LIPASE, BLOOD  URINALYSIS, ROUTINE W REFLEX MICROSCOPIC  RPR  HIV ANTIBODY (ROUTINE TESTING)  I-STAT BETA HCG BLOOD, ED ([facility], [facility], [facility] ONLY)  GC/CHLAMYDIA PROBE AMP ([place]) NOT AT [facility]    EKG None  Radiology Ct Abdomen Pelvis W Contrast  Result Date: 09/13/2017 CLINICAL DATA:  Left lower quadrant abdominal pain beginning yesterday. EXAM: CT ABDOMEN AND PELVIS WITH CONTRAST TECHNIQUE: Multidetector CT imaging of the abdomen and pelvis was performed using the standard protocol following bolus administration of intravenous contrast. CONTRAST:  100mL OMNIPAQUE IOHEXOL 300 MG/ML  SOLN COMPARISON:  None. FINDINGS: Lower chest: Clear lung bases.  Heart normal in size. Hepatobiliary: No focal liver abnormality is seen. No gallstones, gallbladder wall thickening, or biliary dilatation. Pancreas: Unremarkable. No pancreatic ductal dilatation or surrounding inflammatory changes. Spleen: Normal in size without focal abnormality. Adrenals/Urinary Tract: No adrenal masses. 6 mm low-density lesion in the lateral midpole the right kidney consistent with a cyst. No other renal masses or lesions. No stones. No hydronephrosis. Ureters are normal in course and in caliber. Bladder is unremarkable. Stomach/Bowel: Stomach is within normal limits. Appendix appears normal. No evidence of bowel wall thickening, distention, or inflammatory changes. Vascular/Lymphatic: No significant vascular findings are present. No enlarged abdominal or pelvic lymph nodes. Reproductive: Uterus and bilateral adnexa are unremarkable. Other: No  abdominal wall hernia or abnormality. No abdominopelvic ascites. Musculoskeletal: No fracture or acute finding. No osteoblastic or osteolytic lesions. There are disc and facet degenerative changes at L4-L5. IMPRESSION: 1. No acute findings within the abdomen or pelvis. No findings to account for left lower quadrant pain. No evidence of diverticulitis. 2. Subcentimeter low-density right renal lesion that is likely a cyst. 3. Degenerative changes at L4-L5 Electronically Signed   By: Amie Portlandavid  Ormond M.D.   On: 09/13/2017 15:49    Procedures Procedures (including critical care time)  Medications Ordered in ED Medications  fentaNYL (SUBLIMAZE) injection 50 mcg (has no administration in time range)  ondansetron (ZOFRAN) injection 4 mg (4 mg Intravenous Given 09/13/17 1217)  fentaNYL (SUBLIMAZE) injection 50 mcg (50 mcg Intravenous Given 09/13/17 1217)  iohexol (OMNIPAQUE) 300 MG/ML solution 100 mL (100 mLs Intravenous Contrast Given 09/13/17 1502)     Initial Impression / Assessment and Plan / ED Course  I have reviewed the triage  vital signs and the nursing notes.  Pertinent labs & imaging results that were available during my care of the patient were reviewed by me and considered in my medical decision making (see chart for details).  Clinical Course as of Sep 13 1617  Sat Sep 13, 2017  1612 CT scan negative for any abnormality. I performed a pelvic examination. Patient with normal external female genitalia, no vaginal discharge.  No CMT.  Left adnexal tenderness without mass on examination.   [AH]  1613 Patient will need non-OB ultrasound to rule out ovarian torsion.  Pain is improved.  Given sign out to Dr. Pricilla Holm who will assume care  per results.   [AH]    Clinical Course User Index [AH] Arthor Captain, PA-C      Final Clinical Impressions(s) / ED Diagnoses   Final diagnoses:  None    ED Discharge Orders    None       Arthor Captain, PA-C 09/13/17 1619    Benjiman Core, MD 09/13/17 1640

## 2017-09-13 NOTE — ED Provider Notes (Signed)
37 yo F who presents with LLQ abd pain. Worse when she coughs. CT scan negative. L adnexal tenderness on pelvic exam.    F/u pelvis u/s, wet prep. Possible treatment for PID.   Course:  Improved with toradol. Pelvic ultrasound with unremarkable ovaries with no evidence of torsion, mildly heterogenous utertus with no discrete masses seen. Wet prep with clue cells, WBC. Per patient,  H/o STI. Will treat for PID here with IM ceftriaxone and home with PO doxycycline. Will also treated for BV with PO flagyl. D/c home in stable condition, return precautions discussed. Patient agreeable with plan for d/c home.   Clinical Impression: 1. Abdominal pain, unspecified abdominal location   2. Bacterial vaginosis     Disposition: Discharge    Molly Ruderucker, Molly Swiney, MD 09/14/17 Molly Reining0003    Molly MemosMesner, Jason, MD 09/14/17 [phone]

## 2017-09-13 NOTE — ED Triage Notes (Signed)
Pt reports LLQ pain that began yesterday, states pain increases when bladder is full and when she urinates Pt reports she feels that her abdomen is swollen on left side. Pt took advil with no relief.

## 2017-09-14 LAB — HIV ANTIBODY (ROUTINE TESTING W REFLEX): HIV Screen 4th Generation wRfx: NONREACTIVE

## 2017-09-14 LAB — RPR: RPR Ser Ql: NONREACTIVE

## 2017-09-15 LAB — GC/CHLAMYDIA PROBE AMP (~~LOC~~) NOT AT ARMC
Chlamydia: NEGATIVE
Neisseria Gonorrhea: NEGATIVE

## 2018-01-20 ENCOUNTER — Encounter (HOSPITAL_COMMUNITY): Payer: Self-pay | Admitting: Emergency Medicine

## 2018-01-20 ENCOUNTER — Emergency Department (HOSPITAL_COMMUNITY)
Admission: EM | Admit: 2018-01-20 | Discharge: 2018-01-20 | Disposition: A | Discharge status: Home / Self Care | Payer: Self-pay | Attending: Emergency Medicine | Admitting: Emergency Medicine

## 2018-01-20 ENCOUNTER — Emergency Department (HOSPITAL_COMMUNITY): Payer: Self-pay

## 2018-01-20 ENCOUNTER — Other Ambulatory Visit: Payer: Self-pay

## 2018-01-20 ENCOUNTER — Encounter (HOSPITAL_COMMUNITY): Payer: Self-pay | Admitting: *Deleted

## 2018-01-20 DIAGNOSIS — Z79899 Other long term (current) drug therapy: Secondary | ICD-10-CM | POA: Insufficient documentation

## 2018-01-20 DIAGNOSIS — W458XXA Other foreign body or object entering through skin, initial encounter: Secondary | ICD-10-CM | POA: Insufficient documentation

## 2018-01-20 DIAGNOSIS — S90452A Superficial foreign body, left great toe, initial encounter: Secondary | ICD-10-CM | POA: Insufficient documentation

## 2018-01-20 DIAGNOSIS — Y9301 Activity, walking, marching and hiking: Secondary | ICD-10-CM | POA: Insufficient documentation

## 2018-01-20 DIAGNOSIS — M795 Residual foreign body in soft tissue: Secondary | ICD-10-CM

## 2018-01-20 DIAGNOSIS — J45909 Unspecified asthma, uncomplicated: Secondary | ICD-10-CM | POA: Insufficient documentation

## 2018-01-20 DIAGNOSIS — Y999 Unspecified external cause status: Secondary | ICD-10-CM | POA: Insufficient documentation

## 2018-01-20 DIAGNOSIS — Y929 Unspecified place or not applicable: Secondary | ICD-10-CM | POA: Insufficient documentation

## 2018-01-20 LAB — CBC
HCT: 44.9 % (ref 36.0–46.0)
Hemoglobin: 13.9 g/dL (ref 12.0–15.0)
MCH: 25 pg — ABNORMAL LOW (ref 26.0–34.0)
MCHC: 31 g/dL (ref 30.0–36.0)
MCV: 80.8 fL (ref 80.0–100.0)
Platelets: 327 10*3/uL (ref 150–400)
RBC: 5.56 MIL/uL — ABNORMAL HIGH (ref 3.87–5.11)
RDW: 14.8 % (ref 11.5–15.5)
WBC: 9.6 10*3/uL (ref 4.0–10.5)
nRBC: 0 % (ref 0.0–0.2)

## 2018-01-20 LAB — BASIC METABOLIC PANEL
Anion gap: 9 (ref 5–15)
BUN: 7 mg/dL (ref 6–20)
CO2: 22 mmol/L (ref 22–32)
Calcium: 9.5 mg/dL (ref 8.9–10.3)
Chloride: 106 mmol/L (ref 98–111)
Creatinine, Ser: 0.73 mg/dL (ref 0.44–1.00)
GFR calc Af Amer: >60 mL/min (ref >60)
GFR calc non Af Amer: >60 mL/min (ref >60)
Glucose, Bld: 100 mg/dL — ABNORMAL HIGH (ref 70–99)
Potassium: 3.5 mmol/L (ref 3.5–5.1)
Sodium: 137 mmol/L (ref 135–145)

## 2018-01-20 MED ORDER — NAPROXEN 250 MG PO TABS
375.0000 mg | ORAL_TABLET | Freq: Once | ORAL | Status: AC
  Administered 2018-01-20: 375 mg via ORAL
  Filled 2018-01-20: qty 2

## 2018-01-20 MED ORDER — TETANUS-DIPHTH-ACELL PERTUSSIS 5-2.5-18.5 LF-MCG/0.5 IM SUSP
0.5000 mL | Freq: Once | INTRAMUSCULAR | Status: AC
Start: 2018-01-20 — End: 2018-01-20
  Administered 2018-01-20: 0.5 mL via INTRAMUSCULAR
  Filled 2018-01-20: qty 0.5

## 2018-01-20 NOTE — Consult Note (Signed)
Reason for Consult:Toe FB Referring Physician: Tionna Ford is an 37 y.o. female.  HPI: Molly Ford stepped on a needle 2-3 weeks ago. She was only aware at the time that she stepped on something that caused pain. She looked at the time but didn't see anything. As the toe has continued to cause pain she came in for evaluation. Her x-rays showed a FB and orthopedic surgery was consulted.  Past Medical History:  Diagnosis Date  . Asthma     Past Surgical History:  Procedure Laterality Date  . BACK SURGERY    . CESAREAN SECTION      No family history on file.  Social History:  reports that she has never smoked. She has never used smokeless tobacco. She reports that she drinks alcohol. She reports that she does not use drugs.  Allergies: No Known Allergies  Medications: I have reviewed the patient's current medications.  No results found for this or any previous visit (from the past 48 hour(s)).  Dg Toe Great Left  Result Date: 01/20/2018 CLINICAL DATA:  Left great toe.  Swelling progressively worsening. EXAM: LEFT GREAT TOE COMPARISON:  None FINDINGS: No acute fracture or dislocation. No aggressive osseous lesion. No periosteal reaction or bone destruction. 17 mm metallic foreign body (needle) in the volar soft tissue of the great toe with the tip abutting the tuft of the first distal phalanx. No other soft tissue abnormality. IMPRESSION: 17 mm metallic foreign body (needle) in the volar soft tissue of the great toe with the tip abutting the tuft of the first distal phalanx. Electronically Signed   By: Elige Ko   On: 01/20/2018 10:41    Review of Systems  Constitutional: Negative for weight loss.  HENT: Negative for ear discharge, ear pain, hearing loss and tinnitus.   Eyes: Negative for blurred vision, double vision, photophobia and pain.  Respiratory: Negative for cough, sputum production and shortness of breath.   Cardiovascular: Negative for chest pain.   Gastrointestinal: Negative for abdominal pain, nausea and vomiting.  Genitourinary: Negative for dysuria, flank pain, frequency and urgency.  Musculoskeletal: Positive for joint pain (Left great toe). Negative for back pain, falls, myalgias and neck pain.  Neurological: Negative for dizziness, tingling, sensory change, focal weakness, loss of consciousness and headaches.  Endo/Heme/Allergies: Does not bruise/bleed easily.  Psychiatric/Behavioral: Negative for depression, memory loss and substance abuse. The patient is not nervous/anxious.    Blood pressure (!) 149/111, pulse 98, temperature 98.6 F (37 C), temperature source Oral, resp. rate 17, height 5\' 3"  (1.6 m), weight 113.4 kg, last menstrual period 12/21/2017, SpO2 99 %. Physical Exam  Constitutional: She appears well-developed and well-nourished. No distress.  HENT:  Head: Normocephalic and atraumatic.  Eyes: Conjunctivae are normal. Right eye exhibits no discharge. Left eye exhibits no discharge. No scleral icterus.  Neck: Normal range of motion.  Cardiovascular: Normal rate and regular rhythm.  Respiratory: Effort normal. No respiratory distress.  Musculoskeletal:  LLE No traumatic wounds, ecchymosis, or rash  Pad of great toe mod TTP, no visible wound or erythema  Sens DPN, SPN, TN intact  Motor EHL, ext, flex, evers 5/5  DP 2+, PT 1+, No significant edema  Neurological: She is alert.  Skin: Skin is warm and dry. She is not diaphoretic.  Psychiatric: She has a normal mood and affect. Her behavior is normal.    Assessment/Plan: Left great toe FB -- Plan removal tomorrow in OR by Dr. Ophelia Charter. NPO after MN.  Freeman Caldron, PA-C Orthopedic Surgery [phone] 01/20/2018, 11:45 AM

## 2018-01-20 NOTE — Progress Notes (Addendum)
Spoke with pt for pre-op call. Pt states when she was pregnant with her child in 2013 she had palpitations and saw a cardiologist "across the street from [facility]" and was told she had a heart murmur. Can't find anything in Epic. Pt cannot remember who she saw. She states he told her to have her tubes tied due to the heart murmur. Pt states she has not felt any palpitations since her son was born.   Addendum - 2:22 PM Finally found a note from Dr. Lewayne Bunting in 2008 stating pt had RV outflow tract/septal ventricular tachycardia. Treated with Verapamil. Do not see another visit after that.

## 2018-01-20 NOTE — ED Provider Notes (Signed)
[facility] EMERGENCY DEPARTMENT Provider Note   CSN: [id number] Arrival date & time: 01/20/18  0913     History   Chief Complaint Chief Complaint  Patient presents with  . Toe Pain    HPI Molly Ford is a 37 y.o. female.  37 y/o female with a PMH of Asthma presents to the ED with a chief complaint of left great toe pain x 2 weeks.  She reports she was walking in her cooperative room but she stepped on an unknown object and reports sharp pain to her left great toe.  States the following weeks her toe has began to swell.  She reports a sharp pain along the lateral aspect of her foot worse with walking and touch.  Tried taking ibuprofen, Motrin for her pain but reports no relieving symptoms.  She does not have a previous history of gout.  She denies any fever, trauma, other complaints.Patient's tetanus status is unknown.      Past Medical History:  Diagnosis Date  . Asthma     Patient Active Problem List   Diagnosis Date Noted  . ASTHMA, INTERMITTENT 11/25/2009  . PANIC ATTACK 03/21/2009  . ALLERGIC RHINITIS 06/24/2008  . HEADACHE 06/24/2008  . 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 HYPERTROPHY, TONSILS 12/26/2006    Past Surgical History:  Procedure Laterality Date  . BACK SURGERY    . CESAREAN SECTION       OB History   None      Home Medications    Prior to Admission medications   Medication Sig Start Date End Date Taking? Authorizing Provider  benzonatate (TESSALON) 100 MG capsule Take 1-2 capsules (100-200 mg total) by mouth 3 (three) times daily as needed for cough. Patient not taking: Reported on 09/13/2017 05/05/15   Collene Gobble, MD  ibuprofen (ADVIL,MOTRIN) 800 MG tablet Take 1 tablet (800 mg total) by mouth every 8 (eight) hours as needed for mild pain. Patient not taking: Reported on 09/13/2017 08/29/16   [name], [name], DO  methocarbamol (ROBAXIN) 500 MG tablet Take 2 tablets (1,000 mg total) by mouth every 8 (eight) hours as needed  for muscle spasms. Patient not taking: Reported on 09/13/2017 08/29/16   [name], [name], DO  metroNIDAZOLE (FLAGYL) 500 MG tablet Take 1 tablet (500 mg total) by mouth 2 (two) times daily. 09/13/17   Diannia Ruder, MD  naproxen sodium (ALEVE) 220 MG tablet Take 440 mg by mouth daily as needed (Pain).     [provider]  oseltamivir (TAMIFLU) 75 MG capsule Take 1 capsule (75 mg total) by mouth 2 (two) times daily. Patient not taking: Reported on 09/13/2017 05/05/15   Collene Gobble, MD    Family History No family history on file.  Social History Social History   Tobacco Use  . Smoking status: Never Smoker  . Smokeless tobacco: Never Used  Substance Use Topics  . Alcohol use: Yes    Comment: once a month  . Drug use: No     Allergies   Patient has no known allergies.   Review of Systems Review of Systems  Constitutional: Negative for fever.  Musculoskeletal: Positive for myalgias. Negative for arthralgias.     Physical Exam Updated Vital Signs BP (!) 149/111 (BP Location: Right Arm)   Pulse 98   Temp 98.6 F (37 C) (Oral)   Resp 17   Ht 5\' 3"  (1.6 m)   Wt 113.4 kg   LMP 12/21/2017 (Exact Date)  SpO2 99%   BMI 44.29 kg/m   Physical Exam  Constitutional: She appears well-developed and well-nourished.  HENT:  Head: Normocephalic and atraumatic.  Neck: Normal range of motion. Neck supple.  Cardiovascular: Normal heart sounds.  Pulses:      Dorsalis pedis pulses are 2+ on the right side.       Posterior tibial pulses are 2+ on the right side.  Pulmonary/Chest: Effort normal.  Abdominal: Soft.  Musculoskeletal:       Left foot: There is tenderness and swelling. There is no deformity and no laceration.       Feet:  Feet:  Right Foot:  Skin Integrity: Positive for erythema.  Skin: Skin is warm and dry.  Nursing note and vitals reviewed.    ED Treatments / Results  Labs (all labs ordered are listed, but only abnormal results are displayed) Labs  Reviewed - No data to display  EKG None  Radiology Dg Toe Great Left  Result Date: 01/20/2018 CLINICAL DATA:  Left great toe.  Swelling progressively worsening. EXAM: LEFT GREAT TOE COMPARISON:  None FINDINGS: No acute fracture or dislocation. No aggressive osseous lesion. No periosteal reaction or bone destruction. 17 mm metallic foreign body (needle) in the volar soft tissue of the great toe with the tip abutting the tuft of the first distal phalanx. No other soft tissue abnormality. IMPRESSION: 17 mm metallic foreign body (needle) in the volar soft tissue of the great toe with the tip abutting the tuft of the first distal phalanx. Electronically Signed   By: Elige Ko   On: 01/20/2018 10:41    Procedures Procedures (including critical care time)  Medications Ordered in ED Medications  naproxen (NAPROSYN) tablet 375 mg (375 mg Oral Given 01/20/18 1059)  Tdap (BOOSTRIX) injection 0.5 mL (0.5 mLs Intramuscular Given 01/20/18 1128)     Initial Impression / Assessment and Plan / ED Course  I have reviewed the triage vital signs and the nursing notes.  Pertinent labs & imaging results that were available during my care of the patient were reviewed by me and considered in my medical decision making (see chart for details).   Patient presents with foreign body on left great toe after stepping on "something "in her bedroom carpeted floor.  DG showed a 17 mm needle along the volar aspect of the left great toe.  Call placed out to Earney Hamburg, PA for his recommendations.  Seen by Earney Hamburg in the ED today patient to be scheduled for OR tomorrow.  At this time I will discharge patient with a work note to return back on Friday.  Patient's blood pressure is elevated during ED visit I suspect this is due to the pain on her toe I have provided her with some naproxen to relieve her pain.  She understands and agrees with management.  Final Clinical Impressions(s) / ED Diagnoses   Final  diagnoses:  Foreign body (FB) in soft tissue    ED Discharge Orders    None       Claude Manges, PA-C 01/20/18 1151    Benjiman Core, MD 01/20/18 1557

## 2018-01-20 NOTE — Discharge Instructions (Addendum)
You have been provided with a copy of your xray, keep this for your records. Your tetanus vaccine has been update during your visit.

## 2018-01-20 NOTE — ED Triage Notes (Signed)
Pt reports stepping on unknown object a few days ago. Reports pain and swelling to the great toe on the left foot. The patient is alert and oriented x4 with no acute distress at triage.

## 2018-01-20 NOTE — Anesthesia Preprocedure Evaluation (Addendum)
Anesthesia Evaluation  Patient identified by MRN, date of birth, ID band Patient awake    Reviewed: Allergy & Precautions, NPO status , Patient's Chart, lab work & pertinent test results  Airway Mallampati: I  TM Distance: >3 FB Neck ROM: Full    Dental   Pulmonary asthma ,    Pulmonary exam normal        Cardiovascular Normal cardiovascular exam+ dysrhythmias      Neuro/Psych Anxiety    GI/Hepatic   Endo/Other    Renal/GU      Musculoskeletal   Abdominal   Peds  Hematology   Anesthesia Other Findings   Reproductive/Obstetrics                             Anesthesia Physical Anesthesia Plan  ASA: II  Anesthesia Plan: General   Post-op Pain Management:    Induction: Intravenous  PONV Risk Score and Plan: 3 and Ondansetron and Treatment may vary due to age or medical condition  Airway Management Planned: LMA  Additional Equipment:   Intra-op Plan:   Post-operative Plan: Extubation in OR  Informed Consent: I have reviewed the patients History and Physical, chart, labs and discussed the procedure including the risks, benefits and alternatives for the proposed anesthesia with the patient or authorized representative who has indicated his/her understanding and acceptance.     Plan Discussed with: CRNA and Surgeon  Anesthesia Plan Comments: (See PAT note written 01/20/2018 by Shonna Chock, PA-C. )       Anesthesia Quick Evaluation

## 2018-01-20 NOTE — Progress Notes (Signed)
Anesthesia Chart Review: Maury Dus  Case:  [id number] Date/Time:  01/21/18 1601   Procedure:  REMOVAL FOREIGN BODY EXTREMITY (Left )   Anesthesia type:  Choice   Pre-op diagnosis:  FB left great toe   Location:  [facility] ROOM 05 / [facility]   Surgeon:  Eldred Manges, MD      DISCUSSION: Patient is a 37 year old female scheduled for the above procedure. She was seen in the ED on 01/20/18 AM for left great toe pain x 2 weeks. At that time she stepped on an unknown object and developed short left great toe pain followed by swelling.  Symptoms did not resolve with time or NSAIDS. Xray in the ED showed:  IMPRESSION: 17 mm metallic foreign body (needle) in the volar soft tissue of the great toe with the tip abutting the tuft of the first distal phalanx.  Ortho consulted. She was discharged home with plans for removal of needle in the OR on 01/21/18.   Other history includes never smoker, asthma, murmur.  - During 04/28/01 c-section she was noted to have non-sustained VT with fluttering sensation and out-patient cardiology evaluation recommended (2003 cardiology notes not available). EP cardiologist Dr. Ladona Ridgel was consulted after 06/05/05 c-section for frequent consecutive (couplets and triplets) PVCs. Per his note, patient had been diagnosed with right ventricular outflow tract ventricular tachycardia that had been treated with verapamil by another physician during that pregnancy. EKG showed "negative to positive precordial lead V3.  The inferior leads were markedly upright and positive as would be expected." At that time (06/05/05) verapamil continued with PRN metoprolol as needed recommended with 24 hour stay in ICU. Dr. Ladona Ridgel last saw her in 2008 with follow-up planned, but I don't see that this has happened. She reported that tubal ligation was recommended due to her "murmur" (likely referring to RV outflow tract VT) and has not had any significant palpitations since her last son was born.  Reviewed  cardiac history with anesthesiologist Autumn Patty, MD. Procedure can likely be done under regional anesthesia, but anesthesiologist to evaluate on the day of surgery and discuss definitive anesthesia plan.   VS: LMP 12/21/2017 (Exact Date)   PROVIDERS: Patient, No Pcp Per She is not currently followed by cardiology, but saw EP cardiologist Lewayne Bunting, MD for RV outflow tract/septal ventricular tachycardia diagnosed in the setting of pregnancy/c-section. Last visit seen was on 12/24/06. She was having worsening palpitations, so he restarted verapamil. Follow-up was planned, but I don't see that this ever happened.    LABS: Labs from 01/20/18 included: Lab Results  Component Value Date   WBC 9.6 01/20/2018   HGB 13.9 01/20/2018   HCT 44.9 01/20/2018   PLT 327 01/20/2018   GLUCOSE 100 (H) 01/20/2018   ALT 21 09/13/2017   AST 14 (L) 09/13/2017   NA 137 01/20/2018   K 3.5 01/20/2018   CL 106 01/20/2018   CREATININE 0.73 01/20/2018   BUN 7 01/20/2018   CO2 22 01/20/2018    EKG: For updated EKG on the day of surgery. In Muse she has multiple EKGs: - 09/03/02: Normal sinus rhythm with frequent and consecutive premature ventricular complexes.  Repetitive runs of nonsustained ventricular tachycardia. - 12/16/03: Sinus rhythm with sinus arrhythmia with frequent premature ventricular complexes. - 08/30/04: Sinus rhythm with sinus arrhythmia. - 09/26/04: Normal sinus rhythm with sinus arrhythmia.  Left atrial enlargement. - 06/04/05: Sinus tachycardia at 101 bpm with frequent and consecutive premature ventricular complexes.   CV:  Echo 02/28/05: LEFT VENTRICLE: - Left ventricular size was normal. - Overall left ventricular systolic function was normal. - Left ventricular ejection fraction was estimated , range being 55    % to 65 %.. - Left ventricular wall thickness was normal. AORTIC VALVE: - The aortic valve was trileaflet. - Aortic valve thickness was  normal. Doppler interpretation(s): - There was no significant aortic valvular regurgitation. MITRAL VALVE: - There was mild thickening of the mitral valve. Doppler interpretation(s): - There was no significant mitral valvular regurgitation. RIGHT VENTRICLE: - Right ventricular size was normal. - Right ventricular systolic function was normal. PULMONIC VALVE: - The pulmonic valve was not well visualized. Doppler interpretation(s): - There was no pulmonic valve stenosis. - There was no significant pulmonic regurgitation. TRICUSPID VALVE: - The tricuspid valve structure was normal. Doppler interpretation(s): - There was no significant tricuspid valvular regurgitation. RIGHT ATRIUM: - Right atrial size was normal. PERICARDIUM: - There was no pericardial effusion. --------------------------------------------------------------- SUMMARY - Overall left ventricular systolic function was normal. Left    ventricular ejection fraction was estimated , range being 55    % to 65 %.   Past Medical History:  Diagnosis Date  . Asthma   . 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 Heart murmur     Past Surgical History:  Procedure Laterality Date  . BACK SURGERY    . CESAREAN SECTION    . TONSILLECTOMY     adenoidectomy  . TUBAL LIGATION      MEDICATIONS: No current facility-administered medications for this encounter.    . benzonatate (TESSALON) 100 MG capsule  . ibuprofen (ADVIL,MOTRIN) 800 MG tablet  . methocarbamol (ROBAXIN) 500 MG tablet  . metroNIDAZOLE (FLAGYL) 500 MG tablet  . naproxen sodium (ALEVE) 220 MG tablet  . oseltamivir (TAMIFLU) 75 MG capsule    Velna Ochs [facility] Short Stay Center/Anesthesiology Phone [phone] 01/20/2018 5:02 PM

## 2018-01-21 ENCOUNTER — Ambulatory Visit (HOSPITAL_COMMUNITY): Payer: Self-pay | Admitting: Vascular Surgery

## 2018-01-21 ENCOUNTER — Observation Stay (HOSPITAL_COMMUNITY)
Admission: RE | Admit: 2018-01-21 | Discharge: 2018-01-21 | Disposition: A | Discharge status: Home / Self Care | Payer: Self-pay | Source: Ambulatory Visit | Attending: Orthopaedic Surgery | Admitting: Orthopaedic Surgery

## 2018-01-21 ENCOUNTER — Encounter (HOSPITAL_COMMUNITY)
Admission: RE | Disposition: A | Discharge status: Home / Self Care | Payer: Self-pay | Source: Ambulatory Visit | Attending: Orthopaedic Surgery

## 2018-01-21 ENCOUNTER — Encounter (HOSPITAL_COMMUNITY): Payer: Self-pay | Admitting: *Deleted

## 2018-01-21 DIAGNOSIS — J45909 Unspecified asthma, uncomplicated: Secondary | ICD-10-CM | POA: Insufficient documentation

## 2018-01-21 DIAGNOSIS — M795 Residual foreign body in soft tissue: Principal | ICD-10-CM | POA: Diagnosis present

## 2018-01-21 DIAGNOSIS — L089 Local infection of the skin and subcutaneous tissue, unspecified: Secondary | ICD-10-CM | POA: Insufficient documentation

## 2018-01-21 HISTORY — DX: Cardiac arrhythmia, unspecified: I49.9

## 2018-01-21 HISTORY — DX: Cardiac murmur, unspecified: R01.1

## 2018-01-21 HISTORY — PX: FOREIGN BODY REMOVAL: SHX962

## 2018-01-21 LAB — POCT PREGNANCY, URINE: Preg Test, Ur: NEGATIVE

## 2018-01-21 SURGERY — REMOVAL FOREIGN BODY EXTREMITY
Anesthesia: General | Site: Toe | Laterality: Left

## 2018-01-21 MED ORDER — DEXMEDETOMIDINE HCL IN NACL 200 MCG/50ML IV SOLN
INTRAVENOUS | Status: DC | PRN
  Administered 2018-01-21 (×5): 4 ug via INTRAVENOUS

## 2018-01-21 MED ORDER — PROPOFOL 10 MG/ML IV BOLUS
INTRAVENOUS | Status: DC | PRN
  Administered 2018-01-21: 200 mg via INTRAVENOUS

## 2018-01-21 MED ORDER — HYDROCODONE-ACETAMINOPHEN 5-325 MG PO TABS
ORAL_TABLET | ORAL | Status: AC
  Filled 2018-01-21: qty 2

## 2018-01-21 MED ORDER — MIDAZOLAM HCL 2 MG/2ML IJ SOLN
INTRAMUSCULAR | Status: AC
  Filled 2018-01-21: qty 2

## 2018-01-21 MED ORDER — HYDROCODONE-ACETAMINOPHEN 5-325 MG PO TABS
2.0000 | ORAL_TABLET | Freq: Once | ORAL | Status: AC
  Administered 2018-01-21: 2 via ORAL

## 2018-01-21 MED ORDER — PROPOFOL 10 MG/ML IV BOLUS
INTRAVENOUS | Status: AC
  Filled 2018-01-21: qty 20

## 2018-01-21 MED ORDER — 0.9 % SODIUM CHLORIDE (POUR BTL) OPTIME
TOPICAL | Status: DC | PRN
  Administered 2018-01-21: 1000 mL

## 2018-01-21 MED ORDER — FENTANYL CITRATE (PF) 100 MCG/2ML IJ SOLN
INTRAMUSCULAR | Status: DC | PRN
  Administered 2018-01-21 (×2): 25 ug via INTRAVENOUS
  Administered 2018-01-21: 50 ug via INTRAVENOUS
  Administered 2018-01-21 (×4): 25 ug via INTRAVENOUS

## 2018-01-21 MED ORDER — ONDANSETRON HCL 4 MG/2ML IJ SOLN
INTRAMUSCULAR | Status: AC
  Filled 2018-01-21: qty 2

## 2018-01-21 MED ORDER — LIDOCAINE HCL (CARDIAC) PF 100 MG/5ML IV SOSY
PREFILLED_SYRINGE | INTRAVENOUS | Status: DC | PRN
  Administered 2018-01-21: 100 mg via INTRAVENOUS

## 2018-01-21 MED ORDER — FENTANYL CITRATE (PF) 250 MCG/5ML IJ SOLN
INTRAMUSCULAR | Status: AC
  Filled 2018-01-21: qty 5

## 2018-01-21 MED ORDER — ONDANSETRON HCL 4 MG/2ML IJ SOLN
INTRAMUSCULAR | Status: DC | PRN
  Administered 2018-01-21: 4 mg via INTRAVENOUS

## 2018-01-21 MED ORDER — HYDROMORPHONE HCL 1 MG/ML IJ SOLN: 0.2500 mg | INTRAMUSCULAR | Status: DC | PRN

## 2018-01-21 MED ORDER — ONDANSETRON HCL 4 MG/2ML IJ SOLN: 4.0000 mg | Freq: Once | INTRAMUSCULAR | Status: DC | PRN

## 2018-01-21 MED ORDER — CEFAZOLIN SODIUM-DEXTROSE 2-4 GM/100ML-% IV SOLN
2.0000 g | INTRAVENOUS | Status: AC
  Administered 2018-01-21: 2 g via INTRAVENOUS

## 2018-01-21 MED ORDER — MIDAZOLAM HCL 5 MG/5ML IJ SOLN
INTRAMUSCULAR | Status: DC | PRN
  Administered 2018-01-21: 2 mg via INTRAVENOUS

## 2018-01-21 MED ORDER — MEPERIDINE HCL 50 MG/ML IJ SOLN: 6.2500 mg | INTRAMUSCULAR | Status: DC | PRN

## 2018-01-21 MED ORDER — CEFAZOLIN SODIUM-DEXTROSE 2-4 GM/100ML-% IV SOLN
INTRAVENOUS | Status: AC
  Filled 2018-01-21: qty 100

## 2018-01-21 MED ORDER — LIDOCAINE 2% (20 MG/ML) 5 ML SYRINGE
INTRAMUSCULAR | Status: AC
  Filled 2018-01-21: qty 5

## 2018-01-21 MED ORDER — PHENYLEPHRINE HCL 10 MG/ML IJ SOLN
INTRAMUSCULAR | Status: DC | PRN
  Administered 2018-01-21: 120 ug via INTRAVENOUS

## 2018-01-21 MED ORDER — POVIDONE-IODINE 10 % EX SWAB: 2.0000 "application " | Freq: Once | CUTANEOUS | Status: DC

## 2018-01-21 MED ORDER — LACTATED RINGERS IV SOLN
INTRAVENOUS | Status: DC
  Administered 2018-01-21: 14:00:00 via INTRAVENOUS

## 2018-01-21 MED ORDER — CHLORHEXIDINE GLUCONATE 4 % EX LIQD: 60.0000 mL | Freq: Once | CUTANEOUS | Status: DC

## 2018-01-21 SURGICAL SUPPLY — 30 items
BANDAGE ACE 4X5 VEL STRL LF (GAUZE/BANDAGES/DRESSINGS) IMPLANT
BNDG COHESIVE 1X5 TAN STRL LF (GAUZE/BANDAGES/DRESSINGS) ×2 IMPLANT
COVER SURGICAL LIGHT HANDLE (MISCELLANEOUS) ×2 IMPLANT
CUFF TOURNIQUET SINGLE 18IN (TOURNIQUET CUFF) ×2 IMPLANT
DRSG EMULSION OIL 3X3 NADH (GAUZE/BANDAGES/DRESSINGS) IMPLANT
DURAPREP 26ML APPLICATOR (WOUND CARE) ×2 IMPLANT
ELECT REM PT RETURN 9FT ADLT (ELECTROSURGICAL)
ELECTRODE REM PT RTRN 9FT ADLT (ELECTROSURGICAL) IMPLANT
GAUZE SPONGE 4X4 12PLY STRL (GAUZE/BANDAGES/DRESSINGS) ×4 IMPLANT
GAUZE XEROFORM 1X8 LF (GAUZE/BANDAGES/DRESSINGS) ×2 IMPLANT
GLOVE BIOGEL PI IND STRL 8 (GLOVE) ×2 IMPLANT
GLOVE BIOGEL PI INDICATOR 8 (GLOVE) ×2
GLOVE ORTHO TXT STRL SZ7.5 (GLOVE) ×4 IMPLANT
GOWN STRL REUS W/ TWL LRG LVL3 (GOWN DISPOSABLE) ×2 IMPLANT
GOWN STRL REUS W/ TWL XL LVL3 (GOWN DISPOSABLE) ×1 IMPLANT
GOWN STRL REUS W/TWL 2XL LVL3 (GOWN DISPOSABLE) IMPLANT
GOWN STRL REUS W/TWL LRG LVL3 (GOWN DISPOSABLE) ×2
GOWN STRL REUS W/TWL XL LVL3 (GOWN DISPOSABLE) ×1
KIT BASIN OR (CUSTOM PROCEDURE TRAY) ×2 IMPLANT
KIT TURNOVER KIT B (KITS) ×2 IMPLANT
MANIFOLD NEPTUNE II (INSTRUMENTS) ×2 IMPLANT
NS IRRIG 1000ML POUR BTL (IV SOLUTION) ×2 IMPLANT
PACK ORTHO EXTREMITY (CUSTOM PROCEDURE TRAY) ×2 IMPLANT
PAD ARMBOARD 7.5X6 YLW CONV (MISCELLANEOUS) ×4 IMPLANT
STRIP CLOSURE SKIN 1/2X4 (GAUZE/BANDAGES/DRESSINGS) ×2 IMPLANT
SUCTION FRAZIER HANDLE 10FR (MISCELLANEOUS)
SUCTION TUBE FRAZIER 10FR DISP (MISCELLANEOUS) IMPLANT
SUT ETHILON 4 0 PS 2 18 (SUTURE) IMPLANT
TOWEL OR 17X26 10 PK STRL BLUE (TOWEL DISPOSABLE) ×2 IMPLANT
TUBE CONNECTING 12X1/4 (SUCTIONS) IMPLANT

## 2018-01-21 NOTE — Discharge Instructions (Signed)
Follow up with Dr. Ophelia Charter Tuesday November 5.          [facility]      [address] [place] [address] Office number: [phone]  Keep foot elevated Keep dressing clean and dry until seen by Dr. Governor Specking 5-325 for pain Take antibiotic as directed

## 2018-01-21 NOTE — Anesthesia Procedure Notes (Addendum)
Procedure Name: LMA Insertion Date/Time: 01/21/2018 4:21 PM Performed by: Rosiland Oz, CRNA Pre-anesthesia Checklist: Patient identified, Emergency Drugs available, Suction available, Patient being monitored and Timeout performed Patient Re-evaluated:Patient Re-evaluated prior to induction Oxygen Delivery Method: Circle system utilized Preoxygenation: Pre-oxygenation with 100% oxygen Induction Type: IV induction LMA: LMA inserted LMA Size: 4.0 Number of attempts: 1 Placement Confirmation: positive ETCO2 and breath sounds checked- equal and bilateral Tube secured with: Tape Dental Injury: Teeth and Oropharynx as per pre-operative assessment

## 2018-01-21 NOTE — Anesthesia Postprocedure Evaluation (Signed)
Anesthesia Post Note  Patient: Molly Ford  Procedure(s) Performed: REMOVAL FOREIGN BODY EXTREMITY (Left Toe)     Patient location during evaluation: PACU Anesthesia Type: General Level of consciousness: awake and alert Pain management: pain level controlled Vital Signs Assessment: post-procedure vital signs reviewed and stable Respiratory status: spontaneous breathing, nonlabored ventilation, respiratory function stable and patient connected to nasal cannula oxygen Cardiovascular status: blood pressure returned to baseline and stable Postop Assessment: no apparent nausea or vomiting Anesthetic complications: no    Last Vitals:  Vitals:   01/21/18 1715 01/21/18 1730  BP: 110/76 118/74  Pulse: 70 60  Resp: 16 12  Temp:    SpO2: 96% 100%    Last Pain:  Vitals:   01/21/18 1730  TempSrc:   PainSc: Asleep                 Draydon Clairmont DAVID

## 2018-01-21 NOTE — Transfer of Care (Signed)
Immediate Anesthesia Transfer of Care Note  Patient: Molly Ford  Procedure(s) Performed: REMOVAL FOREIGN BODY EXTREMITY (Left Toe)  Patient Location: PACU  Anesthesia Type:General  Level of Consciousness: awake and patient cooperative  Airway & Oxygen Therapy: Patient Spontanous Breathing  Post-op Assessment: Report given to RN and Post -op Vital signs reviewed and stable  Post vital signs: Reviewed and stable  Last Vitals:  Vitals Value Taken Time  BP    Temp    Pulse    Resp    SpO2      Last Pain:  Vitals:   01/21/18 1404  TempSrc:   PainSc: 5       Patients Stated Pain Goal: 5 (01/21/18 1404)  Complications: No apparent anesthesia complications

## 2018-01-21 NOTE — Interval H&P Note (Signed)
History and Physical Interval Note:  01/21/2018 2:32 PM  Molly Ford  has presented today for surgery, with the diagnosis of FB left great toe  The various methods of treatment have been discussed with the patient and family. After consideration of risks, benefits and other options for treatment, the patient has consented to  Procedure(s): REMOVAL FOREIGN BODY EXTREMITY (Left) as a surgical intervention .  The patient's history has been reviewed, patient examined, no change in status, stable for surgery.  I have reviewed the patient's chart and labs.  Questions were answered to the patient's satisfaction.     Eldred Manges

## 2018-01-21 NOTE — H&P (Signed)
Molly Ford is an 37 y.o. female.  HPI: Jaidence stepped on a needle 2-3 weeks ago. She was only aware at the time that she stepped on something that caused pain. She looked at the time but didn't see anything. As the toe has continued to cause pain she came in for evaluation. Her x-rays showed a FB and orthopedic surgery was consulted.      Past Medical History:  Diagnosis Date  . Asthma          Past Surgical History:  Procedure Laterality Date  . BACK SURGERY    . CESAREAN SECTION      No family history on file.  Social History:  reports that she has never smoked. She has never used smokeless tobacco. She reports that she drinks alcohol. She reports that she does not use drugs.  Allergies: No Known Allergies  Medications: I have reviewed the patient's current medications.  LabResultsLast48Hours  No results found for this or any previous visit (from the past 48 hour(s)).     ImagingResults(Last48hours)  Dg Toe Great Left  Result Date: 01/20/2018 CLINICAL DATA:  Left great toe.  Swelling progressively worsening. EXAM: LEFT GREAT TOE COMPARISON:  None FINDINGS: No acute fracture or dislocation. No aggressive osseous lesion. No periosteal reaction or bone destruction. 17 mm metallic foreign body (needle) in the volar soft tissue of the great toe with the tip abutting the tuft of the first distal phalanx. No other soft tissue abnormality. IMPRESSION: 17 mm metallic foreign body (needle) in the volar soft tissue of the great toe with the tip abutting the tuft of the first distal phalanx. Electronically Signed   By: Elige Ko   On: 01/20/2018 10:41     Review of Systems  Constitutional: Negative for weight loss.  HENT: Negative for ear discharge, ear pain, hearing loss and tinnitus.   Eyes: Negative for blurred vision, double vision, photophobia and pain.  Respiratory: Negative for cough, sputum production and shortness of breath.    Cardiovascular: Negative for chest pain.  Gastrointestinal: Negative for abdominal pain, nausea and vomiting.  Genitourinary: Negative for dysuria, flank pain, frequency and urgency.  Musculoskeletal: Positive for joint pain (Left great toe). Negative for back pain, falls, myalgias and neck pain.  Neurological: Negative for dizziness, tingling, sensory change, focal weakness, loss of consciousness and headaches.  Endo/Heme/Allergies: Does not bruise/bleed easily.  Psychiatric/Behavioral: Negative for depression, memory loss and substance abuse. The patient is not nervous/anxious.    Blood pressure (!) 149/111, pulse 98, temperature 98.6 F (37 C), temperature source Oral, resp. rate 17, height 5\' 3"  (1.6 m), weight 113.4 kg, last menstrual period 12/21/2017, SpO2 99 %. Physical Exam  Constitutional: She appears well-developed and well-nourished. No distress.  HENT:  Head: Normocephalic and atraumatic.  Eyes: Conjunctivae are normal. Right eye exhibits no discharge. Left eye exhibits no discharge. No scleral icterus.  Neck: Normal range of motion.  Cardiovascular: Normal rate and regular rhythm.  Respiratory: Effort normal. No respiratory distress.  Musculoskeletal:  LLE     No traumatic wounds, ecchymosis, or rash             Pad of great toe mod TTP, no visible wound or erythema             Sens DPN, SPN, TN intact             Motor EHL, ext, flex, evers 5/5             DP 2+,  PT 1+, No significant edema  Neurological: She is alert.  Skin: Skin is warm and dry. She is not diaphoretic.  Psychiatric: She has a normal mood and affect. Her behavior is normal.    Assessment/Plan: Left great toe FB -- Plan removal tomorrow in OR by Dr. Ophelia Charter. NPO after MN.    Freeman Caldron, PA-C Orthopedic Surgery [phone] 01/20/2018, 11:45 AM         Cosigned by: Eldred Manges, MD at 01/20/2018 12:18 PM

## 2018-01-21 NOTE — Op Note (Signed)
Preop diagnosis: Left great toe foreign body with infection.  Postop diagnosis: Same  Procedure: Irrigation and debridement of left distal great toe tuft infection , removal of broken needle.   Surgeon: Annell Greening, MD  Anesthesia: General  Tourniquet: Applied but never inflated.  Brief history 37 year old female stepped on something sharp in the carpet about 2 weeks ago.  Last 2448 hrs. she is had significant increased pain and presented to the emergency room last night where x-rays demonstrated a broken needle 7 to 12 mm underneath the skin and extending to the tip of the distal phalanx.  She had pain and tenderness, no chills or fever.  Procedure: After induction general anesthesia midcalf tourniquet DuraPrep the usual extremity sheets and drapes timeout procedure preoperative Ancef 2 g prophylaxis a short 23 needle was placed using preoperative x-rays and spot mini C arm image was taken that showed the needle was running parallel and overlapped about 3 to 4 mm.  It appeared to entry site was proximal phalanx along the lateral aspect and extended from proximal distal at 45 degree angle.  Small stab incision was made with a 15 blade and then spreading with the tip of the straight mosquito hemostat some purulence was encountered and a culture was obtained.  This was opened a distance of 1 cm and hemostat was inserted spreading draining the Felon or distal tuft infection.  Hemostat was then inserted needle was grasped on first attempt and was removed.  Spreading all the way across the opposite side to make sure there is adequate drainage followed by copious irrigation with bulb syringe.  Xeroform 4 x 4's and Coban was then applied postoperatively for dressing.  Patient received preoperative Ancef will be discharged on Keflex and will be checked on Tuesday in the office when cultures can be reviewed.

## 2018-01-21 NOTE — Interval H&P Note (Signed)
History and Physical Interval Note:  01/21/2018 2:48 PM  Molly Ford  has presented today for surgery, with the diagnosis of FB left great toe  The various methods of treatment have been discussed with the patient and family. After consideration of risks, benefits and other options for treatment, the patient has consented to  Procedure(s): REMOVAL FOREIGN BODY EXTREMITY (Left) as a surgical intervention .  The patient's history has been reviewed, patient examined, no change in status, stable for surgery.  I have reviewed the patient's chart and labs.  Questions were answered to the patient's satisfaction.     Eldred Manges

## 2018-01-22 ENCOUNTER — Encounter (HOSPITAL_COMMUNITY): Payer: Self-pay | Admitting: Orthopaedic Surgery

## 2018-01-24 LAB — AEROBIC CULTURE  (SUPERFICIAL SPECIMEN)

## 2018-01-24 LAB — AEROBIC CULTURE W GRAM STAIN (SUPERFICIAL SPECIMEN): Culture: NORMAL

## 2018-01-29 ENCOUNTER — Ambulatory Visit (INDEPENDENT_AMBULATORY_CARE_PROVIDER_SITE_OTHER): Payer: Self-pay | Admitting: Surgery

## 2018-01-29 ENCOUNTER — Encounter (INDEPENDENT_AMBULATORY_CARE_PROVIDER_SITE_OTHER): Payer: Self-pay | Admitting: Surgery

## 2018-01-29 VITALS — Ht 63.0 in | Wt 250.0 lb

## 2018-01-29 DIAGNOSIS — M79675 Pain in left toe(s): Secondary | ICD-10-CM

## 2018-01-29 MED ORDER — HYDROCODONE-ACETAMINOPHEN 5-325 MG PO TABS: 1.0000 | ORAL_TABLET | Freq: Four times a day (QID) | ORAL | 0 refills | Status: DC | PRN

## 2018-01-29 NOTE — Progress Notes (Signed)
37 year old black female who is about 1 week status post left great toe foreign body removal returns.  Patient states that she has gone back to work.  Has a couple more days p.o. antibiotic.  Great toe is sore.  Exam Great toe is swollen.  No signs of infection.  Wound healed.  No drainage.  She does have mild left calf soreness dorsal foot soreness.  Negative Homans.  Neurovascularly intact.   Plan Advised patient that I recommend her being off for a few days to make sure that she is probably elevating her foot.  We did discuss the risk of delayed healing, DVT etc.  Taking her out of work until Monday.  She does return back to work I still recommend foot elevation.  Will finish out the remaining of her antibiotic.  Follow-up next Wednesday with Dr. Ophelia Charter for recheck.

## 2018-01-30 NOTE — Discharge Summary (Signed)
Patient ID: Molly Ford MRN: [id number] DOB/AGE: [birth year] 37 y.o.  Admit date: 01/21/2018 Discharge date: 01/30/2018  Admission Diagnoses:  Active Problems:   Foreign body (FB) in soft tissue   Discharge Diagnoses:  Active Problems:   Foreign body (FB) in soft tissue  status post Procedure(s): REMOVAL FOREIGN BODY EXTREMITY  Past Medical History:  Diagnosis Date  . Asthma   . 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 Heart murmur     Surgeries: Procedure(s): REMOVAL FOREIGN BODY EXTREMITY on 01/21/2018   Consultants:   Discharged Condition: Improved  Hospital Course: Molly Ford is an 37 y.o. female who was admitted 01/21/2018 for operative treatment of great toe foreign body. Patient failed conservative treatments (please see the history and physical for the specifics) and had severe unremitting pain that affects sleep, daily activities and work/hobbies. After pre-op clearance, the patient was taken to the operating room on 01/21/2018 and underwent  Procedure(s): REMOVAL FOREIGN BODY EXTREMITY.    Patient was given perioperative antibiotics:  Anti-infectives (From admission, onward)   Start     Dose/Rate Route Frequency Ordered Stop   01/21/18 1600  ceFAZolin (ANCEF) IVPB 2g/100 mL premix     2 g 200 mL/hr over 30 Minutes Intravenous On call to O.R. 01/21/18 1352 01/21/18 1625   01/21/18 1358  ceFAZolin (ANCEF) 2-4 GM/100ML-% IVPB    Note to Pharmacy:  Sandi Raveling   : cabinet override      01/21/18 1358 01/21/18 1625       Patient was given sequential compression devices and early ambulation to prevent DVT.   Patient benefited maximally from hospital stay and there were no complications. At the time of discharge, the patient was urinating/moving their bowels without difficulty, tolerating a regular diet, pain is controlled with oral pain medications and they have been cleared by PT/OT.    Recent vital signs: No data found.   Recent laboratory studies: No results for input(s): WBC, HGB, HCT, PLT, NA, K, CL, CO2, BUN, CREATININE, GLUCOSE, INR, CALCIUM in the last 72 hours.  Invalid input(s): PT, 2   Discharge Medications:   Allergies as of 01/21/2018   No Known Allergies     Medication List    ASK your doctor about these medications   albuterol 108 (90 Base) MCG/ACT inhaler Commonly known as:  PROVENTIL HFA;VENTOLIN HFA Inhale 2 puffs into the lungs every 6 (six) hours as needed for wheezing or shortness of breath.       Diagnostic Studies: Dg Toe Great Left  Result Date: 01/20/2018 CLINICAL DATA:  Left great toe.  Swelling progressively worsening. EXAM: LEFT GREAT TOE COMPARISON:  None FINDINGS: No acute fracture or dislocation. No aggressive osseous lesion. No periosteal reaction or bone destruction. 17 mm metallic foreign body (needle) in the volar soft tissue of the great toe with the tip abutting the tuft of the first distal phalanx. No other soft tissue abnormality. IMPRESSION: 17 mm metallic foreign body (needle) in the volar soft tissue of the great toe with the tip abutting the tuft of the first distal phalanx. Electronically Signed   By: Elige Ko   On: 01/20/2018 10:41      Follow-up Information    Schedule an appointment as soon as possible for a visit with Eldred Manges, MD.   Specialty:  Orthopedic Surgery Why:  need return office visit one week postop Contact information: [address] [place]  [place] [address] [phone]           Discharge Plan:  discharge to home  Disposition:     Signed: Zonia Kief  01/30/2018, 2:53 PM

## 2018-02-06 ENCOUNTER — Ambulatory Visit (INDEPENDENT_AMBULATORY_CARE_PROVIDER_SITE_OTHER): Payer: MEDICAID | Admitting: Orthopaedic Surgery

## 2018-03-13 ENCOUNTER — Emergency Department (HOSPITAL_COMMUNITY): Payer: Self-pay

## 2018-03-13 ENCOUNTER — Emergency Department (HOSPITAL_COMMUNITY)
Admission: EM | Admit: 2018-03-13 | Discharge: 2018-03-13 | Disposition: A | Discharge status: Home / Self Care | Payer: Self-pay | Attending: Emergency Medicine | Admitting: Emergency Medicine

## 2018-03-13 ENCOUNTER — Encounter (HOSPITAL_COMMUNITY): Payer: Self-pay | Admitting: Emergency Medicine

## 2018-03-13 DIAGNOSIS — R1032 Left lower quadrant pain: Secondary | ICD-10-CM | POA: Insufficient documentation

## 2018-03-13 DIAGNOSIS — J45909 Unspecified asthma, uncomplicated: Secondary | ICD-10-CM | POA: Insufficient documentation

## 2018-03-13 LAB — URINALYSIS, ROUTINE W REFLEX MICROSCOPIC
Bilirubin Urine: NEGATIVE
Glucose, UA: NEGATIVE mg/dL
Hgb urine dipstick: NEGATIVE
Ketones, ur: NEGATIVE mg/dL
Leukocytes, UA: NEGATIVE
Nitrite: NEGATIVE
Protein, ur: NEGATIVE mg/dL
Specific Gravity, Urine: 1.014 (ref 1.005–1.030)
pH: 7 (ref 5.0–8.0)

## 2018-03-13 LAB — CBC WITH DIFFERENTIAL/PLATELET
Abs Immature Granulocytes: 0.17 10*3/uL — ABNORMAL HIGH (ref 0.00–0.07)
Basophils Absolute: 0.1 10*3/uL (ref 0.0–0.1)
Basophils Relative: 0 %
Eosinophils Absolute: 0 10*3/uL (ref 0.0–0.5)
Eosinophils Relative: 0 %
HCT: 41.3 % (ref 36.0–46.0)
Hemoglobin: 13.1 g/dL (ref 12.0–15.0)
Immature Granulocytes: 1 %
Lymphocytes Relative: 11 %
Lymphs Abs: 2.4 10*3/uL (ref 0.7–4.0)
MCH: 25.4 pg — ABNORMAL LOW (ref 26.0–34.0)
MCHC: 31.7 g/dL (ref 30.0–36.0)
MCV: 80.2 fL (ref 80.0–100.0)
Monocytes Absolute: 1.6 10*3/uL — ABNORMAL HIGH (ref 0.1–1.0)
Monocytes Relative: 7 %
Neutro Abs: 17.6 10*3/uL — ABNORMAL HIGH (ref 1.7–7.7)
Neutrophils Relative %: 81 %
Platelets: 302 10*3/uL (ref 150–400)
RBC: 5.15 MIL/uL — ABNORMAL HIGH (ref 3.87–5.11)
RDW: 15.1 % (ref 11.5–15.5)
WBC: 21.8 10*3/uL — ABNORMAL HIGH (ref 4.0–10.5)
nRBC: 0 % (ref 0.0–0.2)

## 2018-03-13 LAB — BASIC METABOLIC PANEL
Anion gap: 10 (ref 5–15)
BUN: 5 mg/dL — ABNORMAL LOW (ref 6–20)
CO2: 23 mmol/L (ref 22–32)
Calcium: 9.2 mg/dL (ref 8.9–10.3)
Chloride: 103 mmol/L (ref 98–111)
Creatinine, Ser: 0.86 mg/dL (ref 0.44–1.00)
GFR calc Af Amer: >60 mL/min (ref >60)
GFR calc non Af Amer: >60 mL/min (ref >60)
Glucose, Bld: 106 mg/dL — ABNORMAL HIGH (ref 70–99)
Potassium: 3.5 mmol/L (ref 3.5–5.1)
Sodium: 136 mmol/L (ref 135–145)

## 2018-03-13 LAB — PREGNANCY, URINE: Preg Test, Ur: NEGATIVE

## 2018-03-13 MED ORDER — CIPROFLOXACIN HCL 500 MG PO TABS: 500.0000 mg | ORAL_TABLET | Freq: Two times a day (BID) | ORAL | 0 refills | Status: DC

## 2018-03-13 MED ORDER — METRONIDAZOLE 500 MG PO TABS: 500.0000 mg | ORAL_TABLET | Freq: Two times a day (BID) | ORAL | 0 refills | Status: DC

## 2018-03-13 MED ORDER — KETOROLAC TROMETHAMINE 30 MG/ML IJ SOLN
30.0000 mg | Freq: Once | INTRAMUSCULAR | Status: AC
  Administered 2018-03-13: 30 mg via INTRAVENOUS
  Filled 2018-03-13: qty 1

## 2018-03-13 MED ORDER — MORPHINE SULFATE (PF) 4 MG/ML IV SOLN
4.0000 mg | Freq: Once | INTRAVENOUS | Status: AC
  Administered 2018-03-13: 4 mg via INTRAVENOUS
  Filled 2018-03-13: qty 1

## 2018-03-13 MED ORDER — IOHEXOL 300 MG/ML  SOLN
100.0000 mL | Freq: Once | INTRAMUSCULAR | Status: AC | PRN
  Administered 2018-03-13: 100 mL via INTRAVENOUS

## 2018-03-13 NOTE — ED Notes (Signed)
Patient transported to CT 

## 2018-03-13 NOTE — ED Notes (Signed)
D/c reviewed with patient 

## 2018-03-13 NOTE — ED Provider Notes (Addendum)
[facility] EMERGENCY DEPARTMENT Provider Note   CSN: [id number] Arrival date & time: 03/13/18  4098     History   Chief Complaint No chief complaint on file.   HPI Molly Ford is a 37 y.o. female.  HPI 37 year old female reports development of left lower quadrant abdominal pain since yesterday.  Pain is been persistent.  It is worse with palpation.  Denies dysuria or urinary frequency.  Chills without documented fever.  No flank pain.  No history of kidney stone.  No history of diverticulitis.  She states she has had 2-3 episodes like this this year.  She does not have a gynecologist.  In June she had similar symptoms and underwent CT imaging and pelvic ultrasound while in the emergency department without clear etiology found at that point.  She denies vaginal complaints.  No vaginal discharge.  No back pain.  No chest pain or shortness of breath.  Pain is mild to moderate in severity and worse with movement and palpation.   Past Medical History:  Diagnosis Date  . Asthma   . 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 Heart murmur     Patient Active Problem List   Diagnosis Date Noted  . Foreign body (FB) in soft tissue 01/21/2018  . ASTHMA, INTERMITTENT 11/25/2009  . PANIC ATTACK 03/21/2009  . ALLERGIC RHINITIS 06/24/2008  . HEADACHE 06/24/2008  . 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 HYPERTROPHY, TONSILS 12/26/2006    Past Surgical History:  Procedure Laterality Date  . BACK SURGERY    . CESAREAN SECTION    . FOREIGN BODY REMOVAL Left 01/21/2018   Procedure: REMOVAL FOREIGN BODY EXTREMITY;  Surgeon: Eldred Manges, MD;  Location: [facility] OR;  Service: Orthopedics;  Laterality: Left;  . TONSILLECTOMY     adenoidectomy  . TUBAL LIGATION       OB History   No obstetric history on file.      Home Medications    Prior to Admission medications   Medication Sig Start Date End Date Taking?  Authorizing Provider  HYDROcodone-acetaminophen (NORCO/VICODIN) 5-325 MG tablet Take 1 tablet by mouth every 6 (six) hours as needed for moderate pain. Patient not taking: Reported on 03/13/2018 01/29/18   Naida Sleight, PA-C    Family History History reviewed. No pertinent family history.  Social History Social History   Tobacco Use  . Smoking status: Never Smoker  . Smokeless tobacco: Never Used  Substance Use Topics  . Alcohol use: Yes    Comment: social  . Drug use: No     Allergies   Patient has no known allergies.   Review of Systems Review of Systems  All other systems reviewed and are negative.    Physical Exam Updated Vital Signs BP 121/78   Pulse 88   Temp 98.1 F (36.7 C) (Oral)   Resp 12   Ht 5\' 3"  (1.6 m)   Wt 113.4 kg   SpO2 100%   BMI 44.29 kg/m   Physical Exam Vitals signs and nursing note reviewed.  Constitutional:      General: She is not in acute distress.    Appearance: She is well-developed.  HENT:     Head: Normocephalic and atraumatic.  Neck:     Musculoskeletal: Normal range of motion.  Cardiovascular:     Rate and Rhythm: Normal rate and regular rhythm.     Heart sounds: Normal heart sounds.  Pulmonary:     Effort: Pulmonary effort is normal.     Breath sounds: Normal breath sounds.  Abdominal:     General: There is no distension.     Palpations: Abdomen is soft.     Comments: Mild left lower quadrant tenderness  Musculoskeletal: Normal range of motion.  Skin:    General: Skin is warm and dry.  Neurological:     Mental Status: She is alert and oriented to person, place, and time.  Psychiatric:        Judgment: Judgment normal.      ED Treatments / Results  Labs (all labs ordered are listed, but only abnormal results are displayed) Labs Reviewed  CBC WITH DIFFERENTIAL/PLATELET - Abnormal; Notable for the following components:      Result Value   WBC 21.8 (*)    RBC 5.15 (*)    MCH 25.4 (*)    Neutro Abs 17.6  (*)    Monocytes Absolute 1.6 (*)    Abs Immature Granulocytes 0.17 (*)    All other components within normal limits  BASIC METABOLIC PANEL - Abnormal; Notable for the following components:   Glucose, Bld 106 (*)    BUN 5 (*)    All other components within normal limits  PREGNANCY, URINE  URINALYSIS, ROUTINE W REFLEX MICROSCOPIC    EKG None  Radiology Ct Abdomen Pelvis W Contrast  Result Date: 03/13/2018 CLINICAL DATA:  Lower abdominal pain, primarily left-sided EXAM: CT ABDOMEN AND PELVIS WITH CONTRAST TECHNIQUE: Multidetector CT imaging of the abdomen and pelvis was performed using the standard protocol following bolus administration of intravenous contrast. CONTRAST:  100mL OMNIPAQUE IOHEXOL 300 MG/ML  SOLN COMPARISON:  September 13, 2017 FINDINGS: Lower chest: There is mild atelectatic change in the anterior left base. Lung bases otherwise are clear. Hepatobiliary: There is mild fatty infiltration near the fissure for the ligamentum teres. No focal liver lesions are apparent. Gallbladder wall is not appreciably thickened. There is no biliary duct dilatation. Pancreas: There is no pancreatic mass or inflammatory focus. Spleen: No splenic lesions are evident. Adrenals/Urinary Tract: Adrenals appear normal bilaterally. There is a 1 x 1 cm cyst in the upper pole right kidney. There is slight scarring along the upper pole left kidney. There is no evident hydronephrosis on either side. There is no renal or ureteral calculus on either side. Urinary bladder is midline with wall thickness within normal limits. Stomach/Bowel: There are scattered colonic diverticula without diverticulitis. There is no appreciable bowel wall or mesenteric thickening. There is no evident bowel obstruction. There is no free air or portal venous air. Vascular/Lymphatic: There is no abdominal aortic aneurysm. No vascular lesions are evident. There is no appreciable adenopathy in the abdomen or pelvis. Reproductive: Uterus is  anteverted. No pelvic mass evident. There are follicles noted in each ovary. Other: A clip is noted in the anterior abdominal region slightly to the left of the umbilical level. Appendix appears unremarkable. There is no abscess or ascites in the abdomen or pelvis. Musculoskeletal: There is spinal stenosis at L4-5 due to bony hypertrophy and disc protrusion. There is vacuum phenomenon at the L4-5 level. There are no blastic or lytic bone lesions. No intramuscular or abdominal wall lesions are evident. IMPRESSION: 1. Scattered colonic diverticula without diverticulitis. No bowel obstruction. No abscess evident in the abdomen or pelvis. Appendix region appears unremarkable. 2. Scarring upper pole left kidney region. No hydronephrosis on either side. No evident renal or ureteral calculus. 3.  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 Electronically Signed   By: Bretta BangWilliam  Woodruff III M.D.   On: 03/13/2018 10:48    Procedures Procedures (including critical care time)  Medications Ordered in ED Medications  morphine 4 MG/ML injection 4 mg (4 mg Intravenous Given 03/13/18 0808)  ketorolac (TORADOL) 30 MG/ML injection 30 mg (30 mg Intravenous Given 03/13/18 0808)     Initial Impression / Assessment and Plan / ED Course  I have reviewed the triage vital signs and the nursing notes.  Pertinent labs & imaging results that were available during my care of the patient were reviewed by me and considered in my medical decision making (see chart for details).     No clear evidence of diverticulitis on CT imaging however with her focal left lower quadrant tenderness and her white count of 21,000 she will be covered for possible early developing diverticulitis with Cipro and Flagyl.  Recommended ibuprofen and Tylenol for pain control.  Patient understands to return the emergency department for new or worsening symptoms.  Final Clinical Impressions(s) / ED Diagnoses   Final diagnoses:  Left  lower quadrant abdominal pain    ED Discharge Orders         Ordered    ciprofloxacin (CIPRO) 500 MG tablet  2 times daily     03/13/18 1104    metroNIDAZOLE (FLAGYL) 500 MG tablet  2 times daily     03/13/18 1104           Azalia Bilisampos, Jamarie Mussa, MD 03/13/18 1108    Azalia Bilisampos, Demone Lyles, MD 03/13/18 1108

## 2018-03-13 NOTE — ED Triage Notes (Signed)
Patient arrived from home reporting left-abdominal pain that started yesterday at 4pm, she reports feeling cool, and feverish. She said if she walks on her tip-toes, she hurts less Denies nausea, vomiting.

## 2018-12-25 ENCOUNTER — Encounter (HOSPITAL_COMMUNITY): Payer: Self-pay | Admitting: Obstetrics and Gynecology

## 2018-12-25 ENCOUNTER — Inpatient Hospital Stay (HOSPITAL_COMMUNITY)
Admission: AD | Admit: 2018-12-25 | Discharge: 2018-12-25 | Disposition: A | Discharge status: Home / Self Care | Payer: Medicaid Other | Attending: Obstetrics & Gynecology | Admitting: Obstetrics & Gynecology

## 2018-12-25 ENCOUNTER — Other Ambulatory Visit: Payer: Self-pay

## 2018-12-25 DIAGNOSIS — Z3202 Encounter for pregnancy test, result negative: Secondary | ICD-10-CM | POA: Diagnosis not present

## 2018-12-25 DIAGNOSIS — R109 Unspecified abdominal pain: Secondary | ICD-10-CM | POA: Diagnosis not present

## 2018-12-25 DIAGNOSIS — N926 Irregular menstruation, unspecified: Secondary | ICD-10-CM | POA: Diagnosis present

## 2018-12-25 LAB — POCT PREGNANCY, URINE: Preg Test, Ur: NEGATIVE

## 2018-12-25 NOTE — MAU Provider Note (Signed)
First Provider Initiated Contact with Patient 12/25/18 1013      S Ms. Molly Ford is a 38 y.o. No obstetric history on file. non-pregnant female who presents to MAU today with complaint of late period and abdominal cramping in the area of her previous c/s scar; rated 7/10. She reports that she "usually gets period on the 20 th of every month, but the last one came on 11/25/2018." She also complains that she has had nausea in the AM. All symptoms have been going on for 2 weeks. She has not taken a HPT. She reports not having a GYN, because unable to pay for insurance while out of work.  O BP (!) 131/91   Pulse 86   Temp (!) 97.3 F (36.3 C)   Resp 16   LMP 11/25/2018  Physical Exam  Nursing note and vitals reviewed. Constitutional: She is oriented to person, place, and time. She appears well-developed and well-nourished.  HENT:  Head: Normocephalic and atraumatic.  Eyes: Pupils are equal, round, and reactive to light.  Neck: Normal range of motion.  Cardiovascular: Normal rate.  Respiratory: Effort normal.  Genitourinary:    Genitourinary Comments: Pelvic deferred   Musculoskeletal: Normal range of motion.  Neurological: She is alert and oriented to person, place, and time.  Psychiatric: She has a normal mood and affect. Her behavior is normal. Judgment and thought content normal.    A Non pregnant female Medical screening exam complete Negative pregnancy test Late menses Abdominal cramping     P Discharge from MAU in stable condition Patient given the option of [facility] further evaluation or seek care in outpatient facility of choice List of options for follow-up given - advised to call [facility] d/t having no insurance at this time. Warning signs for worsening condition that would warrant emergency follow-up discussed Patient may return to MAU as needed for pregnancy related complaints  Laury Deep, CNM 12/25/2018 10:13 AM

## 2018-12-25 NOTE — MAU Note (Signed)
.   Molly Ford is a 38 y.o. at Unknown here in MAU reporting: that she is suppose to start her cycle and she is late. Lower abdominal tenderness in her abdomen where her C/S was and nausea in the mornings LMP: 11/25/18 Onset of complaint:  2 weeks   Pain score: 7 Vitals:   12/25/18 0953  BP: (!) 131/91  Pulse: 86  Resp: 16  Temp: (!) 97.3 F (36.3 C)     FHT: Lab orders placed from triage: UPT

## 2019-03-03 ENCOUNTER — Ambulatory Visit (INDEPENDENT_AMBULATORY_CARE_PROVIDER_SITE_OTHER): Payer: Medicaid Other | Admitting: Family Medicine

## 2019-03-03 ENCOUNTER — Encounter: Payer: Self-pay | Admitting: Family Medicine

## 2019-03-03 ENCOUNTER — Other Ambulatory Visit: Payer: Self-pay

## 2019-03-03 DIAGNOSIS — F339 Major depressive disorder, recurrent, unspecified: Secondary | ICD-10-CM

## 2019-03-03 DIAGNOSIS — F329 Major depressive disorder, single episode, unspecified: Secondary | ICD-10-CM | POA: Insufficient documentation

## 2019-03-03 MED ORDER — FLUOXETINE HCL 20 MG PO TABS: 20.0000 mg | ORAL_TABLET | Freq: Every day | ORAL | 2 refills | Status: DC

## 2019-03-03 NOTE — Progress Notes (Signed)
  Patient Name: Molly Ford Date of Birth: 1980-09-13 Date of Visit: 03/03/19 PCP: Patient, No Pcp Per  Chief Complaint: depressed and stressed   Subjective: ANALLELY ROSELL is a 38 y.o. with medical history significant for asthma and arthritis  presenting today in order to establish care.   Depressed Mood   NILZA EAKER states that she has been experiencing difficulty with staying asleep since the death of her toddler 10 years ago. Ms. Pettie reports that the anniversary of her son's death recently passed on 2019-03-18 and she noticed increased feelings of stress, feeling "locked in", crying spells and the desire to distance herself from others. These more intense episodes occur up to three times per week. She reports being hesistant to start counseling in the past but is now more amenable to speaking with a counselor. Ms. Mcclory also adds that she feels she has a limited social support system. Ms. Priego denies any suicidal ideation currently or in the past. Patient also reports having insomnia since the death of son. She describes her sleep pattern as typically falling asleep for no more than 3 hours per night. She typically wakes around 2 am and is unable to fall asleep again so she remains awake for the remainder of the day. She denies any daytime sleepiness or need to take naps. She denies any symptoms consistent with mania such as going days without sleeping. I counseled the patient that her sleep may be related to depression as she often cannot sleep after thinking of her son.  I have reviewed the patient's medical, surgical, family, and social history as appropriate and is listed in the history section of her chart.  Vitals:   03/03/19 1348  BP: 128/68  Pulse: 90  SpO2: 99%    Physical Exam:   General: Alert and cooperative and appears to be in no acute distress HEENT: Neck non-tender without lymphadenopathy, masses or thyromegaly Cardio: Normal S1 and S2, no S3 or S4. Rhythm is regular.  No murmurs or rubs appreciated on my exam today.   Pulm: Clear to auscultation bilaterally, no crackles, wheezing, or diminished breath sounds. Normal respiratory effort Abdomen: Bowel sounds normal. Abdomen soft and non-tender.  Extremities: No peripheral edema. Warm/ well perfused.  Neuro: alert and oriented x4 Psych: affect congruent with stated mood, well groomed, normal rate of speech, thought content is linear, demonstrates good insight  Depression screen [facility] 2/9 03/03/2019 03/03/2019 05/05/2015  Decreased Interest 0 0 0  Down, Depressed, Hopeless 3 3 0  PHQ - 2 Score 3 3 0  Altered sleeping 1 - -  Tired, decreased energy 0 - -  Change in appetite 3 - -  Feeling bad or failure about yourself  2 - -  Trouble concentrating 0 - -  Moving slowly or fidgety/restless 0 - -  Suicidal thoughts 0 - -  PHQ-9 Score 9 - -  Difficult doing work/chores Not difficult at all - -   Assessment & Plan:   Depression Patient reports decreased sleep, depressed mood, isolation and crying spells. PHQ score of 9 today categorizing her for mild depression. Patient reports loss of son has been hard to cope with since 2010.  -prescribe fluoxetine 20mg  daily  -follow up virtual visit in 2 weeks for mood check  -list of therapists covered my medicaid reviewed and given to patient  Return to care in 2 weeks for virtual visit as scheduled.   Stark Klein, MD  Family Medicine  PGY1

## 2019-03-03 NOTE — Assessment & Plan Note (Signed)
Patient reports decreased sleep, depressed mood, isolation and crying spells. PHQ score of 9 today categorizing her for mild depression. Patient reports loss of son has been hard to cope with since 2010.  -prescribe fluoxetine 20mg  daily  -follow up virtual visit in 2 weeks for mood check  -list of therapists covered my medicaid reviewed and given to patient

## 2019-03-03 NOTE — Patient Instructions (Addendum)
Thank you for allowing me to take part in your care. It was a pleasure meeting you today!   We discussed:   Depression/Anxiety  I am prescribing you a medication to help with the symptoms that we discussed. Please follow up with me via a virtual visit in 2 weeks so I can see how things are going with you, your sleep and the medication.  -The medicine is fluoxetine 20mg  that is for depression and anxiety.      Diverticulosis  Diverticulosis is a condition that develops when small pouches (diverticula) form in the wall of the large intestine (colon). The colon is where water is absorbed and stool is formed. The pouches form when the inside layer of the colon pushes through weak spots in the outer layers of the colon. You may have a few pouches or many of them. What are the causes? The cause of this condition is not known. What increases the risk? The following factors may make you more likely to develop this condition:  Being older than age 42. Your risk for this condition increases with age. Diverticulosis is rare among people younger than age 26. By age 36, many people have it.  Eating a low-fiber diet.  Having frequent constipation.  Being overweight.  Not getting enough exercise.  Smoking.  Taking over-the-counter pain medicines, like aspirin and ibuprofen.  Having a family history of diverticulosis. What are the signs or symptoms? In most people, there are no symptoms of this condition. If you do have symptoms, they may include:  Bloating.  Cramps in the abdomen.  Constipation or diarrhea.  Pain in the lower left side of the abdomen. How is this diagnosed? This condition is most often diagnosed during an exam for other colon problems. Because diverticulosis usually has no symptoms, it often cannot be diagnosed independently. This condition may be diagnosed by:  Using a flexible scope to examine the colon (colonoscopy).  Taking an X-ray of the colon after dye has  been put into the colon (barium enema).  Doing a CT scan. How is this treated? You may not need treatment for this condition if you have never developed an infection related to diverticulosis. If you have had an infection before, treatment may include:  Eating a high-fiber diet. This may include eating more fruits, vegetables, and grains.  Taking a fiber supplement.  Taking a live bacteria supplement (probiotic).  Taking medicine to relax your colon.  Taking antibiotic medicines. Follow these instructions at home:  Drink 6-8 glasses of water or more each day to prevent constipation.  Try not to strain when you have a bowel movement.  If you have had an infection before: ? Eat more fiber as directed by your health care provider or your diet and nutrition specialist (dietitian). ? Take a fiber supplement or probiotic, if your health care provider approves.  Take over-the-counter and prescription medicines only as told by your health care provider.  If you were prescribed an antibiotic, take it as told by your health care provider. Do not stop taking the antibiotic even if you start to feel better.  Keep all follow-up visits as told by your health care provider. This is important. Contact a health care provider if:  You have pain in your abdomen.  You have bloating.  You have cramps.  You have not had a bowel movement in 3 days. Get help right away if:  Your pain gets worse.  Your bloating becomes very bad.  You have a  fever or chills, and your symptoms suddenly get worse.  You vomit.  You have bowel movements that are bloody or black.  You have bleeding from your rectum. Summary  Diverticulosis is a condition that develops when small pouches (diverticula) form in the wall of the large intestine (colon).  You may have a few pouches or many of them.  This condition is most often diagnosed during an exam for other colon problems.  If you have had an infection  related to diverticulosis, treatment may include increasing the fiber in your diet, taking supplements, or taking medicines. This information is not intended to replace advice given to you by your health care provider. Make sure you discuss any questions you have with your health care provider.   Document Released: 12/07/2003 Document Revised: 02/21/2017 Document Reviewed: 01/29/2016 Elsevier Patient Education  2020 [place].   Major Depressive Disorder, Adult Major depressive disorder (MDD) is a mental health condition. MDD often makes you feel sad, hopeless, or helpless. MDD can also cause symptoms in your body. MDD can affect your:  Work.  School.  Relationships.  Other normal activities. MDD can range from mild to very bad. It may occur once (single episode MDD). It can also occur many times (recurrent MDD). The main symptoms of MDD often include:  Feeling sad, depressed, or irritable most of the time.  Loss of interest. MDD symptoms also include:  Sleeping too much or too little.  Eating too much or too little.  A change in your weight.  Feeling tired (fatigue) or having low energy.  Feeling worthless.  Feeling guilty.  Trouble making decisions.  Trouble thinking clearly.  Thoughts of suicide or harming others.  Feeling weak.  Feeling agitated.  Keeping yourself from being around other people (isolation). Follow these instructions at home: Activity  Do these things as told by your doctor: ? Go back to your normal activities. ? Exercise regularly. ? Spend time outdoors. Alcohol  Talk with your doctor about how alcohol can affect your antidepressant medicines.  Do not drink alcohol. Or, limit how much alcohol you drink. ? This means no more than 1 drink a day for nonpregnant women and 2 drinks a day for men. One drink equals one of these:  12 oz of beer.  5 oz of wine.  1 oz of hard liquor. General instructions  Take over-the-counter and  prescription medicines only as told by your doctor.  Eat a healthy diet.  Get plenty of sleep.  Find activities that you enjoy. Make time to do them.  Think about joining a support group. Your doctor may be able to suggest a group for you.  Keep all follow-up visits as told by your doctor. This is important. Where to find more information:  [employer] on Mental Illness: ? www.nami.[place]: ? https://carter.com/  National Suicide Prevention Lifeline: ? (985) 168-1541. This is free, 24-hour help. Contact a doctor if:  Your symptoms get worse.  You have new symptoms. Get help right away if:  You self-harm.  You see, hear, taste, smell, or feel things that are not present (hallucinate). If you ever feel like you may hurt yourself or others, or have thoughts about taking your own life, get help right away. You can go to your nearest emergency department or call:  Your local emergency services (911 in the U.S.).  A suicide crisis helpline, such as the National Suicide Prevention Lifeline: ? (878)591-2964. This is open 24 hours  a day. This information is not intended to replace advice given to you by your health care provider. Make sure you discuss any questions you have with your health care provider. Document Released: 02/20/2015 Document Revised: 02/21/2017 Document Reviewed: 11/26/2015 Elsevier Patient Education  2020 [employer].    Foods to Eat to help with Diverticulosis   Diverticulosis is a condition in which small, bulging pouches (diverticuli) form inside the lower part of the intestine, usually in the colon. Constipation and straining during bowel movements can worsen the condition. A diet rich in fiber can help keep stools soft and prevent inflammation.  Diverticulitis occurs when the pouches in the colon become infected or inflamed. Dietary changes can help the colon heal.  Fiber is an important part of the diet for  patients with diverticulosis. A high-fiber diet softens and gives bulk to the stool, allowing it to pass quickly and easily.  Diet for Diverticulosis Eat a high-fiber diet when you have diverticulosis. Fiber softens the stool and helps prevent constipation. It also can help decrease pressure in the colon and help prevent flare-ups of diverticulitis.  High-fiber foods include:  Beans and legumes Bran, whole wheat bread and whole grain cereals such as oatmeal Brown and wild rice Fruits such as apples, bananas and pears Vegetables such as broccoli, carrots, corn and squash Whole wheat pasta If you currently don't have a diet high in fiber, you should add fiber gradually. This helps avoid bloating and abdominal discomfort. The target is to eat 25 to 30 grams of fiber daily. Drink at least 8 cups of fluid daily. Fluid will help soften your stool. Exercise also promotes bowel movement and helps prevent constipation.  When the colon is not inflamed, eat popcorn, nuts and seeds as tolerated.  Diet for Diverticulitis During flare ups of diverticulitis, follow a clear liquid diet. Your doctor will let you know when to progress from clear liquids to low fiber solids and then back to your normal diet.  A clear liquid diet means no solid foods. Juices should have no pulp. During the clear liquid diet, you may consume:  Broth Clear juices such as apple, cranberry and grape. (Avoid orange juice) Jell-O Popsicles When you're able to eat solid food, choose low fiber foods while healing. Low fiber foods include:  Canned or cooked fruit without seeds or skin, such as applesauce and melon Canned or well cooked vegetables without seeds and skin Dairy products such as cheese, milk and yogurt Eggs Low-fiber cereal Meat that is ground or tender and well cooked Pasta White bread and white rice After symptoms improve, usually within two to four days, you may add 5 to 15 grams of fiber a day back into your  diet. Resume your high fiber diet when you no longer have symptoms.  [facility] medical specialists have reviewed this information. It is for educational purposes only and is not intended to replace the advice of your doctor or other health care provider. We encourage you to discuss any questions or concerns you may have with your provider.     Major Depressive Disorder, Adult Major depressive disorder (MDD) is a mental health condition. MDD often makes you feel sad, hopeless, or helpless. MDD can also cause symptoms in your body. MDD can affect your:  Work.  School.  Relationships.  Other normal activities. MDD can range from mild to very bad. It may occur once (single episode MDD). It can also occur many times (recurrent MDD). The main symptoms of MDD  often include:  Feeling sad, depressed, or irritable most of the time.  Loss of interest. MDD symptoms also include:  Sleeping too much or too little.  Eating too much or too little.  A change in your weight.  Feeling tired (fatigue) or having low energy.  Feeling worthless.  Feeling guilty.  Trouble making decisions.  Trouble thinking clearly.  Thoughts of suicide or harming others.  Feeling weak.  Feeling agitated.  Keeping yourself from being around other people (isolation). Follow these instructions at home: Activity  Do these things as told by your doctor: ? Go back to your normal activities. ? Exercise regularly. ? Spend time outdoors. Alcohol  Talk with your doctor about how alcohol can affect your antidepressant medicines.  Do not drink alcohol. Or, limit how much alcohol you drink. ? This means no more than 1 drink a day for nonpregnant women and 2 drinks a day for men. One drink equals one of these:  12 oz of beer.  5 oz of wine.  1 oz of hard liquor. General instructions  Take over-the-counter and prescription medicines only as told by your doctor.  Eat a healthy diet.  Get plenty  of sleep.  Find activities that you enjoy. Make time to do them.  Think about joining a support group. Your doctor may be able to suggest a group for you.  Keep all follow-up visits as told by your doctor. This is important. Where to find more information:  [employer] on Mental Illness: ? www.nami.org  [place] [employer] of Mental Health: ? http://www.maynard.net/  National Suicide Prevention Lifeline: ? 2813295962. This is free, 24-hour help. Contact a doctor if:  Your symptoms get worse.  You have new symptoms. Get help right away if:  You self-harm.  You see, hear, taste, smell, or feel things that are not present (hallucinate). If you ever feel like you may hurt yourself or others, or have thoughts about taking your own life, get help right away. You can go to your nearest emergency department or call:  Your local emergency services (911 in the U.S.).  A suicide crisis helpline, such as the National Suicide Prevention Lifeline: ? 425-886-5177. This is open 24 hours a day. This information is not intended to replace advice given to you by your health care provider. Make sure you discuss any questions you have with your health care provider. Document Released: 02/20/2015 Document Revised: 02/21/2017 Document Reviewed: 11/26/2015 Elsevier Patient Education  2020 [employer].   Therapy and Counseling Resources Most providers on this list will take Medicaid. Patients with commercial insurance or Medicare should contact their insurance company to get a list of in network providers.  [facility] [address]  [place], [place] [address]       [phone]     [facility] [address] [place], [place]  [phone]    [facility] [address] [place], [place] [phone]  [facility]:  [address] [place] [phone]  [facility]:  [address] [place], [place] [phone]  [facility] (under & uninsured) [address]   [place] [phone]    [email or web address]    [facility] [address]     Phone:  [phone]     [facility]-  [address]  [phone]   [facility] [address]      [place], [place] [phone]  [facility]: [address], [place], [place]  [phone]   [facility] (Spanish therapist) [address]   [place] [id number]    [phone]    [facility]   [address],  [place], [place]  [phone]   [facility]  [address] [place]  [phone]  [facility]  [address] , [place]        [phone]  [facility] under & uninsured [facility],  [address], [place] [phone]):  Mon - Fri from 8 AM - 3 PM  [facility] [address] [facility] [place],  (Spanish)   [address], [place]: [phone]) 8:30 - 12; 1 - 2:30  [facility],  [address], [place]    ([phone]):8:30 - 12; 2 - 3PM  [facility],  [address],  [place]; [phone]):   Mon - Fri 8 AM - 5 PM  [facility] [address] [place]  MWF 12:30 to 3:00 or call to schedule an appointment  [phone]  Specific Provider options Psychology Today  https://www.psychologytoday.com/us 1. click on find a therapist  2. enter your zip code 3. left side and select or tailor a therapist for your specific need.   [facility] Provider Directory [email or web address]  (Medicaid)   Follow all drop down to find a provider  Social Support program [facility] [place] [phone]) [phone] or [email or web address] [address], [place], [place] Recovery  support and educational   In home counseling [facility] Telephone: [phone]  office in [place] [email or web address]   Does not take reg. Medicaid or Medicare private insurance [facility], [place] health Choice, [facility], [place], [place], [place], [place] Health Choice  24- Hour Availability:  . [facility]   [phone] or [phone]  . [facility]  Crisis Line [phone]  [facility]  [phone]   . [facility]  [phone] (after hours)  . Therapeutic Alternative/Mobile Crisis   [phone]  . [place] National Suicide Hotline  ([phone] (TALK)  . Call 911 or go to emergency room  . [facility] Crisis Line  [phone]);  [place] and [employer]   . [employer] ACCESS  [phone]); [place], [place], [place], [place], [place], [place], [place]   .

## 2019-03-08 ENCOUNTER — Other Ambulatory Visit: Payer: Self-pay

## 2019-03-08 ENCOUNTER — Telehealth (INDEPENDENT_AMBULATORY_CARE_PROVIDER_SITE_OTHER): Payer: Medicaid Other | Admitting: Family Medicine

## 2019-03-08 DIAGNOSIS — B309 Viral conjunctivitis, unspecified: Secondary | ICD-10-CM | POA: Diagnosis not present

## 2019-03-08 NOTE — Progress Notes (Signed)
Virtual Visit via Video Note  I connected with Molly Ford on 03/08/19 at  9:30 AM EST by a video enabled telemedicine application and verified that I am speaking with the correct person using two identifiers.  Location: Patient: Home Provider: [facility]   I discussed the limitations of evaluation and management by telemedicine and the availability of in person appointments. The patient expressed understanding and agreed to proceed.  History of Present Illness: Patient states that on Wednesday of last week she noticed her left eye was becoming red and feeling irritated.  She states that this has slowly worsened and that now she does notice more irritation along with some pain in bright sunlight.  She states she recently went to the eye doctor and was restarted on contacts about 2-3 weeks ago but did not wear any contacts for about a week prior to this eye issue starting.  She admits to some blurry vision in the left eye and attributes this to the irritation.  She states she does have some drainage but this is clear and she has no crusting or mucus present.  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  Patient states she has been using Visine red eye drops but these have not provided much help.  She denies cough, congestion, fevers, headache, dizziness, pain with eye movement.   Observations/Objective: Patient with left eye with some conjunctival erythema compared to right, difficult to appreciate in video.  Patient appears to be in no respiratory distress and speaks in full sentences without difficulty during our visit.  Assessment and Plan: Certainly think viral conjunctivitis sounds most likely.  Less likely bacterial as patient has no mucous/crusting.  Patient has no history of glaucoma and her history does not sound as concerning for something like closed angle glaucoma.  Other  items on the differential can include arthritis and uveitis, particular with the patient experiencing some pain in bright light.  Plan: -Instructed patient that she should come in for a in person visit within the next day or 2 -Gave patient return precautions of when to go to the ED, such as sudden change in vision or  dramatic increase in eye pain - Work note provided via [employer] -Recommended patient continue symptomatic care until follow-up with moisturizing eyedrops  Follow Up Instructions:  I discussed the assessment and treatment plan with the patient. The patient was provided an opportunity to ask questions and all were answered. The patient agreed with the plan and demonstrated an understanding of the instructions.   The patient was advised to call back or seek an in-person evaluation if the symptoms worsen or if the condition fails to improve as anticipated.  I provided 15 minutes of non-face-to-face time during this encounter.   Molly Del, DO

## 2019-03-08 NOTE — Patient Instructions (Signed)
It was great to see you!  Our plans for today:  - I would like for you to make a follow-up appointment to be seen in person to better evaluate your eye -It is recommended that she stay out of work until your eye is producing less drainage.  I have provided a work note for you via [employer]. -If you notice a sudden change in vision, increased pain, or other concerning symptoms please seek care sooner than your follow-up appointment.   Take care and seek immediate care sooner if you develop any concerns.   Dr. [facility]

## 2019-03-10 ENCOUNTER — Ambulatory Visit (INDEPENDENT_AMBULATORY_CARE_PROVIDER_SITE_OTHER): Payer: Medicaid Other | Admitting: Family Medicine

## 2019-03-10 ENCOUNTER — Other Ambulatory Visit: Payer: Self-pay

## 2019-03-10 DIAGNOSIS — B309 Viral conjunctivitis, unspecified: Secondary | ICD-10-CM

## 2019-03-10 NOTE — Progress Notes (Signed)
Subjective  Molly Ford is a 38 y.o. female is presenting with the following  EYE COMPLAINT  Eye symptoms started 4 days ago in L eye then yesterday in R eye Eye symptom progression:  L eye is improving but now R eye feels mildly painful and blurry vision with crusty discharge in AM Other people with same problem: no Medications tried:  visine Eye Trauma: no Contact Lens: yes has been wearing them longer than usual for several days at a time before symptoms started Recent eye surgeries: no  Symptoms Itching: mild Eye discharge or mattering: in AMs Vision impairment: slightly blurry but can't check vision due to not having her glasses Photophobia: mild stared with L now in R Nose discharge: no Sneezing: no Vomiting: no  ROS see HPI Smoking Status noted  Chief Complaint noted Review of Symptoms - see HPI PMH - Smoking status noted.    Objective Vital Signs reviewed BP (!) 142/82   Pulse 85   Wt 261 lb 9.6 oz (118.7 kg)   SpO2 99%   BMI 46.34 kg/m  Eye - Pupils Equal Round Reactive to light, Extraocular movements intact, Fundi without hemorrhage or visible lesions, Conjunctiva on L shows mild peripheral redness R only trace.  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   Her photophobia is mild and is improving in first eye involved.  She is aware she has exopthalmus.  No signs of corneal abrasion or ulcer but is at risk given contact use.  We discussed this.  If not improving as expected should have further exam    See after visit summary for details of patient instructions

## 2019-03-10 NOTE — Assessment & Plan Note (Signed)
Symptoms and exam consistent with viral conjunctivitis given the progression.   Her photophobia is mild and is improving in first eye involved.  She is aware she has exopthalmus.  No signs of corneal abrasion or ulcer but is at risk given contact use.  We discussed this.  If not improving as expected should have further exam

## 2019-03-10 NOTE — Patient Instructions (Addendum)
Good to see you today!  Thanks for coming in.  I think you have viral conjunctivitis It should be gone from the Left by Friday and the R eye in another week  If they are getting worse or not back to normal in one week or severe pain or loss of vision then need to be seen right away  Do not wear contacts until your eyes are normal and then change daily   Return to work when the irritation redness is gone

## 2019-03-17 ENCOUNTER — Telehealth (INDEPENDENT_AMBULATORY_CARE_PROVIDER_SITE_OTHER): Payer: Medicaid Other | Admitting: Family Medicine

## 2019-03-17 ENCOUNTER — Other Ambulatory Visit: Payer: Self-pay

## 2019-03-17 ENCOUNTER — Encounter: Payer: Self-pay | Admitting: Family Medicine

## 2019-03-17 DIAGNOSIS — F339 Major depressive disorder, recurrent, unspecified: Secondary | ICD-10-CM

## 2019-03-17 DIAGNOSIS — G4700 Insomnia, unspecified: Secondary | ICD-10-CM

## 2019-03-17 DIAGNOSIS — F329 Major depressive disorder, single episode, unspecified: Secondary | ICD-10-CM

## 2019-03-17 DIAGNOSIS — F32A Depression, unspecified: Secondary | ICD-10-CM

## 2019-03-17 NOTE — Progress Notes (Signed)
[facility] Telemedicine Visit  Patient consented to have virtual visit. Method of visit: Telephone  Encounter participants: Patient: Molly Ford - located in car, pulled over roadside.  Provider: Stark Klein - located at [facility] Others (if applicable): none  Chief Complaint: f/u for mood   HPI: Depression on fluoxetine Molly Ford states there has not been much change in her mood since starting the fluoxetine.  She reports that she has been taking the medication daily.  I reemphasized that they could take up to 6 weeks for her to feel any difference on the medication.  Patient was agreeable with the plan to reassess her mood and ability to sleep at next appointment on 04/08/2019.  In the interim since our initial visit, patient states that she does feel less sad than at the beginning of the month.  Patient denies suicidal and homicidal ideation.   Insomnia Patient reports that she has still been having a hard time staying asleep.  She states that she sleeps on average 2 to 3 hours per night.  Patient states that she has tried Z quill but felt too sleepy the next day and would not like to try that again.  Patient request more information on use of melatonin, stating that she has tried melatonin in the past with feel very sleepy the next day and was concerned that she took the pills "too late". I have asked patient to take melatonin perhaps at a lower dose such as 3 mg or split 5 mg in half and take melatonin 1 hour prior to desired bedtime.  Patient was agreeable with this plan.  Update on viral conjunctivitis States that she has been recently seen for viral conjunctivitis and is no longer having drainage and color has returned to normal.   ROS: per HPI Review of Systems  Psychiatric/Behavioral: Positive for depression. Negative for hallucinations and suicidal ideas. The patient has insomnia. The patient is not nervous/anxious.    Pertinent PMHx:  none  Exam:  Respiratory: speaking in full sentences, no gasping between words, pleasantly conversational  Mood: stated as "ok, better than it was", denies SI & HI  Assessment/Plan:  Depression Patient counseled on allowing 6 weeks for fluoxetine to make a difference in mood. Patient agreeable to continuing on medication. Patient states her mood has improved since our visit 2 weeks ago. She denies SI/HI.  -continue fluoxetine Outpatient Encounter Medications as of 03/17/2019  Medication Sig  . FLUoxetine (PROZAC) 20 MG tablet Take 1 tablet (20 mg total) by mouth daily.   No facility-administered encounter medications on file as of 03/17/2019.         F/u as scheduled on 04/08/19  Time spent during visit with patient: 20 minutes

## 2019-03-20 NOTE — Assessment & Plan Note (Signed)
Patient counseled on allowing 6 weeks for fluoxetine to make a difference in mood. Patient agreeable to continuing on medication. Patient states her mood has improved since our visit 2 weeks ago. She denies SI/HI.  -continue fluoxetine Outpatient Encounter Medications as of 03/17/2019  Medication Sig  . FLUoxetine (PROZAC) 20 MG tablet Take 1 tablet (20 mg total) by mouth daily.   No facility-administered encounter medications on file as of 03/17/2019.

## 2019-03-21 ENCOUNTER — Encounter (HOSPITAL_COMMUNITY): Payer: Self-pay | Admitting: Emergency Medicine

## 2019-03-21 ENCOUNTER — Other Ambulatory Visit: Payer: Self-pay

## 2019-03-21 ENCOUNTER — Emergency Department (HOSPITAL_COMMUNITY)
Admission: EM | Admit: 2019-03-21 | Discharge: 2019-03-21 | Disposition: A | Discharge status: Home / Self Care | Payer: Medicaid Other | Attending: Emergency Medicine | Admitting: Emergency Medicine

## 2019-03-21 DIAGNOSIS — J45909 Unspecified asthma, uncomplicated: Secondary | ICD-10-CM | POA: Diagnosis not present

## 2019-03-21 DIAGNOSIS — Z79899 Other long term (current) drug therapy: Secondary | ICD-10-CM | POA: Diagnosis not present

## 2019-03-21 DIAGNOSIS — R509 Fever, unspecified: Secondary | ICD-10-CM | POA: Diagnosis present

## 2019-03-21 DIAGNOSIS — U071 COVID-19: Secondary | ICD-10-CM | POA: Diagnosis not present

## 2019-03-21 DIAGNOSIS — Z20822 Contact with and (suspected) exposure to covid-19: Secondary | ICD-10-CM

## 2019-03-21 LAB — INFLUENZA PANEL BY PCR (TYPE A & B)
Influenza A By PCR: NEGATIVE
Influenza B By PCR: NEGATIVE

## 2019-03-21 MED ORDER — IBUPROFEN 400 MG PO TABS
600.0000 mg | ORAL_TABLET | Freq: Once | ORAL | Status: AC
  Administered 2019-03-21: 600 mg via ORAL
  Filled 2019-03-21: qty 1

## 2019-03-21 NOTE — ED Notes (Signed)
Patient verbalizes understanding of discharge instructions. Opportunity for questioning and answers were provided. Armband removed by staff, pt discharged from ED.  

## 2019-03-21 NOTE — ED Triage Notes (Addendum)
Pt reports she has been exposed to two positive covid people X 2 in the past week. Pt has generalized body aches and a mild cough. Pt upset and tearful in triage because they did not tell her they had covid prior to her being around them.

## 2019-03-21 NOTE — ED Notes (Signed)
Pt ambulated self efficiently with no difficulty. Pt o2 sat @ 100% RA sitting at bedside. During ambulation pt o2 sat remained @ 100% RA. Pt returned safely to bedside with no difficulty breathing and o2 sat @ 100% RA.

## 2019-03-21 NOTE — ED Provider Notes (Signed)
[facility] EMERGENCY DEPARTMENT Provider Note   CSN: [id number] Arrival date & time: 03/21/19  1539     History No chief complaint on file.   Molly Ford is a 38 y.o. female.  Patient is a 38 year old female with past medical history of asthma, obesity presenting to the emergency department for Covid exposure.  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  Also reports that she was babysitting a child who had a fever this week and is also being tested.  She reports that she had some chills and a fever today but otherwise is feeling "fine".  Denies any shortness of breath, chest pain, nausea, vomiting, diarrhea, dysuria, coughing or other URI symptoms.        Past Medical History:  Diagnosis Date  . Arthritis 2014  . Asthma   . 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 Heart murmur     Patient Active Problem List   Diagnosis Date Noted  . Viral conjunctivitis 03/08/2019  . Depression 03/03/2019  . Late menses 12/25/2018  . Foreign body (FB) in soft tissue 01/21/2018  . ASTHMA, INTERMITTENT 11/25/2009  . PANIC ATTACK 03/21/2009  . ALLERGIC RHINITIS 06/24/2008  . HEADACHE 06/24/2008  . 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 HYPERTROPHY, TONSILS 12/26/2006    Past Surgical History:  Procedure Laterality Date  . BACK SURGERY  2013  . CESAREAN SECTION  2003, 2007  . FOREIGN BODY REMOVAL Left 01/21/2018   Procedure: REMOVAL FOREIGN BODY EXTREMITY;  Surgeon: Marybelle Killings, MD;  Location: [facility];  Service: Orthopedics;  Laterality: Left;  . TONSILLECTOMY     adenoidectomy  . TUBAL LIGATION       OB History   No obstetric history on file.     Family History  Problem Relation Age of Onset  . Diabetes Mother   . High blood pressure Mother   . High blood pressure Sister     Social  History   Tobacco Use  . Smoking status: Never Smoker  . Smokeless tobacco: Never Used  Substance Use Topics  . Alcohol use: Yes    Comment: social  . Drug use: No    Home Medications Prior to Admission medications   Medication Sig Start Date End Date Taking? Authorizing Provider  FLUoxetine (PROZAC) 20 MG tablet Take 1 tablet (20 mg total) by mouth daily. 03/03/19   Stark Klein, MD    Allergies    Patient has no known allergies.  Review of Systems   Review of Systems  Constitutional: Positive for chills and fever. Negative for activity change, appetite change, diaphoresis and fatigue.  HENT: Negative for congestion, rhinorrhea, sinus pain and sore throat.   Respiratory: Negative for cough, chest tightness, shortness of breath and wheezing.   Cardiovascular: Negative for chest pain.  Gastrointestinal: Negative for abdominal pain, nausea and vomiting.  Genitourinary: Negative for dysuria.  Musculoskeletal: Negative for back pain and myalgias.  Skin: Negative for rash.  Allergic/Immunologic: Negative for immunocompromised state.  Neurological: Negative for dizziness, light-headedness and headaches.    Physical Exam Updated Vital Signs BP (!) 159/96   Pulse (!) 106   Temp (!) 101.4 F (38.6 C)   Resp 18   LMP 03/05/2019   SpO2 100%   Physical Exam Vitals and nursing note reviewed.  Constitutional:  General: She is not in acute distress.    Appearance: Normal appearance. She is not ill-appearing, toxic-appearing or diaphoretic.  HENT:     Head: Normocephalic.     Mouth/Throat:     Mouth: Mucous membranes are moist.  Eyes:     Conjunctiva/sclera: Conjunctivae normal.  Cardiovascular:     Rate and Rhythm: Normal rate and regular rhythm.  Pulmonary:     Effort: Pulmonary effort is normal.     Breath sounds: Normal breath sounds.  Skin:    General: Skin is warm and dry.  Neurological:     Mental Status: She is alert.  Psychiatric:        Mood and  Affect: Mood normal.     ED Results / Procedures / Treatments   Labs (all labs ordered are listed, but only abnormal results are displayed) Labs Reviewed  NOVEL CORONAVIRUS, NAA (HOSP ORDER, SEND-OUT TO REF LAB; TAT 18-24 HRS)  INFLUENZA PANEL BY PCR (TYPE A & B)    EKG None  Radiology No results found.  Procedures Procedures (including critical care time)  Medications Ordered in ED Medications  ibuprofen (ADVIL) tablet 600 mg (600 mg Oral Given 03/21/19 1719)    ED Course  I have reviewed the triage vital signs and the nursing notes.  Pertinent labs & imaging results that were available during my care of the patient were reviewed by me and considered in my medical decision making (see chart for details).    MDM Rules/Calculators/A&P                      Based on review of vitals, medical screening exam, lab work and/or imaging, there does not appear to be an acute, emergent etiology for the patient's symptoms. Counseled pt on good return precautions and encouraged both PCP and ED follow-up as needed.  Prior to discharge, I also discussed incidental imaging findings with patient in detail and advised appropriate, recommended follow-up in detail.  Clinical Impression: 1. Suspected COVID-19 virus infection     Disposition: Discharge  Prior to providing a prescription for a controlled substance, I independently reviewed the patient's recent prescription history on the [place] Controlled Substance Reporting System. The patient had no recent or regular prescriptions and was deemed appropriate for a brief, less than 3 day prescription of narcotic for acute analgesia.  This note was prepared with assistance of Conservation officer, historic buildings. Occasional wrong-word or sound-a-like substitutions may have occurred due to the inherent limitations of voice recognition software.  Final Clinical Impression(s) / ED Diagnoses Final diagnoses:  Suspected COVID-19 virus  infection    Rx / DC Orders ED Discharge Orders    None       Jeral Pinch 03/21/19 1737    Milagros Loll, MD 03/22/19 [phone]

## 2019-03-21 NOTE — Discharge Instructions (Addendum)
You were seen today for fever after being exposed to covid 19. We are testing you for both the flu and covid and will call you if your results are positive. You should go home and quarantine yourself until you have had symptoms for at least 10 days and are feeling improved without a fever for at least 3 days. If you feel like you are having severe symptoms then please return to the ER for re-evaluation.

## 2019-03-22 ENCOUNTER — Ambulatory Visit: Payer: Medicaid Other

## 2019-03-22 LAB — NOVEL CORONAVIRUS, NAA (HOSP ORDER, SEND-OUT TO REF LAB; TAT 18-24 HRS): SARS-CoV-2, NAA: DETECTED — AB

## 2019-03-23 ENCOUNTER — Telehealth (HOSPITAL_COMMUNITY): Payer: Self-pay

## 2019-03-31 ENCOUNTER — Telehealth: Payer: Self-pay

## 2019-03-31 ENCOUNTER — Telehealth: Payer: Self-pay | Admitting: Family Medicine

## 2019-03-31 NOTE — Telephone Encounter (Signed)
Called and spoke with patient in order to confirm that she received positive COVID test results. Patient confirmed that she was aware and had been in quarantine since 03/22/19. Patient requested MyChart message with the word "positive" instead of "detected" for her results at the request of her employer. Message has been sent. Patient reports feeling improved.

## 2019-03-31 NOTE — Telephone Encounter (Signed)
Pt calling today to ask Dr. Sharol Harness if a letter can be written to say that the pt tested for COVID on 03/21/2019. The letter needs to have her name, date of birth and say she had a positive result. Please make sure this goes to mychart. Sunday Spillers, CMA

## 2019-04-08 ENCOUNTER — Ambulatory Visit: Payer: Medicaid Other

## 2019-04-08 ENCOUNTER — Encounter: Payer: Self-pay | Admitting: Family Medicine

## 2019-04-08 ENCOUNTER — Other Ambulatory Visit: Payer: Self-pay

## 2019-04-08 ENCOUNTER — Ambulatory Visit (INDEPENDENT_AMBULATORY_CARE_PROVIDER_SITE_OTHER): Payer: Medicaid Other | Admitting: Family Medicine

## 2019-04-08 VITALS — BP 104/68 | HR 100 | Wt 251.0 lb

## 2019-04-08 DIAGNOSIS — J4520 Mild intermittent asthma, uncomplicated: Secondary | ICD-10-CM

## 2019-04-08 MED ORDER — ALBUTEROL SULFATE HFA 108 (90 BASE) MCG/ACT IN AERS: 2.0000 | INHALATION_SPRAY | Freq: Four times a day (QID) | RESPIRATORY_TRACT | 1 refills | Status: DC | PRN

## 2019-04-08 NOTE — Patient Instructions (Signed)
I have prescribed an inhaler for you to use as a rescue inhaler.   If you have to use this every 2 hours consistently, you will need to call us and be evaluated.   Please follow up with in 6 weeks for your pap smear.   Best Wishes in Health,  Dr. Sharol Harness     Asthma, Adult  Asthma is a long-term (chronic) condition in which the airways get tight and narrow. The airways are the breathing passages that lead from the nose and mouth down into the lungs. A person with asthma will have times when symptoms get worse. These are called asthma attacks. They can cause coughing, whistling sounds when you breathe (wheezing), shortness of breath, and chest pain. They can make it hard to breathe. There is no cure for asthma, but medicines and lifestyle changes can help control it. There are many things that can bring on an asthma attack or make asthma symptoms worse (triggers). Common triggers include:  Mold.  Dust.  Cigarette smoke.  Cockroaches.  Things that can cause allergy symptoms (allergens). These include animal skin flakes (dander) and pollen from trees or grass.  Things that pollute the air. These may include household cleaners, wood smoke, smog, or chemical odors.  Cold air, weather changes, and wind.  Crying or laughing hard.  Stress.  Certain medicines or drugs.  Certain foods such as dried fruit, potato chips, and grape juice.  Infections, such as a cold or the flu.  Certain medical conditions or diseases.  Exercise or tiring activities. Asthma may be treated with medicines and by staying away from the things that cause asthma attacks. Types of medicines may include:  Controller medicines. These help prevent asthma symptoms. They are usually taken every day.  Fast-acting reliever or rescue medicines. These quickly relieve asthma symptoms. They are used as needed and provide short-term relief.  Allergy medicines if your attacks are brought on by allergens.  Medicines  to help control the body's defense (immune) system. Follow these instructions at home: Avoiding triggers in your home  Change your heating and air conditioning filter often.  Limit your use of fireplaces and wood stoves.  Get rid of pests (such as roaches and mice) and their droppings.  Throw away plants if you see mold on them.  Clean your floors. Dust regularly. Use cleaning products that do not smell.  Have someone vacuum when you are not home. Use a vacuum cleaner with a HEPA filter if possible.  Replace carpet with wood, tile, or vinyl flooring. Carpet can trap animal skin flakes and dust.  Use allergy-proof pillows, mattress covers, and box spring covers.  Wash bed sheets and blankets every week in hot water. Dry them in a dryer.  Keep your bedroom free of any triggers.  Avoid pets and keep windows closed when things that cause allergy symptoms are in the air.  Use blankets that are made of polyester or cotton.  Clean bathrooms and kitchens with bleach. If possible, have someone repaint the walls in these rooms with mold-resistant paint. Keep out of the rooms that are being cleaned and painted.  Wash your hands often with soap and water. If soap and water are not available, use hand sanitizer.  Do not allow anyone to smoke in your home. General instructions  Take over-the-counter and prescription medicines only as told by your doctor. ? Talk with your doctor if you have questions about how or when to take your medicines. ? Make note if you  need to use your medicines more often than usual.  Do not use any products that contain nicotine or tobacco, such as cigarettes and e-cigarettes. If you need help quitting, ask your doctor.  Stay away from secondhand smoke.  Avoid doing things outdoors when allergen counts are high and when air quality is low.  Wear a ski mask when doing outdoor activities in the winter. The mask should cover your nose and mouth. Exercise indoors  on cold days if you can.  Warm up before you exercise. Take time to cool down after exercise.  Use a peak flow meter as told by your doctor. A peak flow meter is a tool that measures how well the lungs are working.  Keep track of the peak flow meter's readings. Write them down.  Follow your asthma action plan. This is a written plan for taking care of your asthma and treating your attacks.  Make sure you get all the shots (vaccines) that your doctor recommends. Ask your doctor about a flu shot and a pneumonia shot.  Keep all follow-up visits as told by your doctor. This is important. Contact a doctor if:  You have wheezing, shortness of breath, or a cough even while taking medicine to prevent attacks.  The mucus you cough up (sputum) is thicker than usual.  The mucus you cough up changes from clear or white to yellow, green, gray, or bloody.  You have problems from the medicine you are taking, such as: ? A rash. ? Itching. ? Swelling. ? Trouble breathing.  You need reliever medicines more than 2-3 times a week.  Your peak flow reading is still at 50-79% of your personal best after following the action plan for 1 hour.  You have a fever. Get help right away if:  You seem to be worse and are not responding to medicine during an asthma attack.  You are short of breath even at rest.  You get short of breath when doing very little activity.  You have trouble eating, drinking, or talking.  You have chest pain or tightness.  You have a fast heartbeat.  Your lips or fingernails start to turn blue.  You are light-headed or dizzy, or you faint.  Your peak flow is less than 50% of your personal best.  You feel too tired to breathe normally. Summary  Asthma is a long-term (chronic) condition in which the airways get tight and narrow. An asthma attack can make it hard to breathe.  Asthma cannot be cured, but medicines and lifestyle changes can help control it.  Make sure  you understand how to avoid triggers and how and when to use your medicines. This information is not intended to replace advice given to you by your health care provider. Make sure you discuss any questions you have with your health care provider. Document Revised: 05/14/2018 Document Reviewed: 04/15/2016 Elsevier Patient Education  2020 [employer].

## 2019-04-08 NOTE — Progress Notes (Signed)
  Patient Name: Molly Ford Date of Birth: May 29, 1980 Date of Visit: 04/12/19 PCP: Nicki Guadalajara, MD  Chief Complaint: physical   Subjective: Molly Ford is a 39 y.o. with medical history significant for asthma, depression and heart murmur presenting today for a physical.   Molly Ford states that she has been taking the fluoxetine as prescribed and adds that her mood is improved. Patient scored one of PHQ-2 in office today. Patient continues to deny SI/HI. Molly Ford states that she was recently diagnosed with COVID-19 and has quarantined for ~2 weeks. Recovery is going well. Patient denies cough,fevers, chills or SOB at rest but still has no sense of taste or smell. Patient is requesting refill for albuterol inhaler as she has been using her son's due to SOB after exertion and climbing stairs. She reports using the inhaler multiple times per week. Denies nighttime cough or wheezing throughout the day.   Patient declined influenza vaccine and requests to reschedule for pap smear.   I have reviewed the patient's medical, surgical, family, and social history as appropriate.  Vitals:   04/08/19 1505  BP: 104/68  Pulse: 100  SpO2: 100%    Physical Exam:   General: Alert and cooperative and appears to be in no acute distress Cardio: Normal S1 and S2, no S3 or S4. Rhythm is regular. No murmurs appreciated on this exam or rubs.   Pulm: Clear to auscultation bilaterally, no crackles, wheezing, or diminished breath sounds. Normal respiratory effort, no coughing noted  Abdomen: Bowel sounds normal. Abdomen soft and non-tender.  Neuro: alert and oriented x3   Assessment & Plan:   Asthma Patient reports SOB after climbing stairs or walking for long distances while grocery shopping. Symptoms increased since covid diagnosis, no night time cough and previous prescription is only for SABA consistent with intermittent asthma.  -refill for albuterol given   Return to care in 2 months.    Nicki Guadalajara, MD  Family Medicine  PGY1

## 2019-04-12 ENCOUNTER — Ambulatory Visit (INDEPENDENT_AMBULATORY_CARE_PROVIDER_SITE_OTHER): Payer: Medicaid Other

## 2019-04-12 ENCOUNTER — Other Ambulatory Visit: Payer: Self-pay

## 2019-04-12 DIAGNOSIS — Z111 Encounter for screening for respiratory tuberculosis: Secondary | ICD-10-CM

## 2019-04-12 NOTE — Assessment & Plan Note (Signed)
Patient reports SOB after climbing stairs or walking for long distances while grocery shopping. Symptoms increased since covid diagnosis, no night time cough and previous prescription is only for SABA consistent with intermittent asthma.  -refill for albuterol given

## 2019-04-12 NOTE — Progress Notes (Signed)
Patient present in nurse clinic for PPD skin test. PPD applied to left arm, wheel present. Patient to return to nurse clinic on 1/20 to have site read. Reminder card given.

## 2019-04-14 ENCOUNTER — Ambulatory Visit: Payer: Medicaid Other

## 2019-04-29 ENCOUNTER — Telehealth: Payer: Self-pay

## 2019-04-29 NOTE — Telephone Encounter (Signed)
LVM for pt to call our office. If pt calls, please ask COVID screening questions. Shann Merrick T Jaicee Michelotti, CMA  

## 2019-04-30 ENCOUNTER — Ambulatory Visit: Payer: Medicaid Other | Admitting: Family Medicine

## 2019-06-09 ENCOUNTER — Ambulatory Visit: Payer: Medicaid Other | Admitting: Family Medicine

## 2020-04-10 ENCOUNTER — Encounter: Payer: Medicaid Other | Admitting: Family Medicine

## 2020-04-17 ENCOUNTER — Ambulatory Visit (INDEPENDENT_AMBULATORY_CARE_PROVIDER_SITE_OTHER): Payer: Medicaid Other | Admitting: Family Medicine

## 2020-04-17 ENCOUNTER — Encounter: Payer: Self-pay | Admitting: Family Medicine

## 2020-04-17 ENCOUNTER — Other Ambulatory Visit: Payer: Self-pay | Admitting: Family Medicine

## 2020-04-17 ENCOUNTER — Encounter: Payer: Medicaid Other | Admitting: Family Medicine

## 2020-04-17 ENCOUNTER — Other Ambulatory Visit: Payer: Self-pay

## 2020-04-17 ENCOUNTER — Other Ambulatory Visit (HOSPITAL_COMMUNITY)
Admission: RE | Admit: 2020-04-17 | Discharge: 2020-04-17 | Disposition: A | Discharge status: Home / Self Care | Payer: Medicaid Other | Source: Ambulatory Visit | Attending: Family Medicine | Admitting: Family Medicine

## 2020-04-17 VITALS — BP 128/80 | HR 91 | Wt 253.2 lb

## 2020-04-17 DIAGNOSIS — Z0000 Encounter for general adult medical examination without abnormal findings: Secondary | ICD-10-CM | POA: Diagnosis not present

## 2020-04-17 DIAGNOSIS — J4520 Mild intermittent asthma, uncomplicated: Secondary | ICD-10-CM | POA: Diagnosis not present

## 2020-04-17 DIAGNOSIS — F339 Major depressive disorder, recurrent, unspecified: Secondary | ICD-10-CM

## 2020-04-17 DIAGNOSIS — Z6830 Body mass index (BMI) 30.0-30.9, adult: Secondary | ICD-10-CM | POA: Diagnosis not present

## 2020-04-17 DIAGNOSIS — N898 Other specified noninflammatory disorders of vagina: Secondary | ICD-10-CM

## 2020-04-17 DIAGNOSIS — Z124 Encounter for screening for malignant neoplasm of cervix: Secondary | ICD-10-CM

## 2020-04-17 DIAGNOSIS — F5101 Primary insomnia: Secondary | ICD-10-CM | POA: Diagnosis not present

## 2020-04-17 DIAGNOSIS — Z1159 Encounter for screening for other viral diseases: Secondary | ICD-10-CM

## 2020-04-17 DIAGNOSIS — G4700 Insomnia, unspecified: Secondary | ICD-10-CM | POA: Insufficient documentation

## 2020-04-17 LAB — POCT WET PREP (WET MOUNT)
Clue Cells Wet Prep Whiff POC: POSITIVE
Trichomonas Wet Prep HPF POC: ABSENT

## 2020-04-17 MED ORDER — FLUTICASONE PROPIONATE HFA 44 MCG/ACT IN AERO: 2.0000 | INHALATION_SPRAY | Freq: Every day | RESPIRATORY_TRACT | 12 refills | Status: AC

## 2020-04-17 MED ORDER — FLUOXETINE HCL 20 MG PO TABS: 20.0000 mg | ORAL_TABLET | Freq: Every day | ORAL | 2 refills | Status: AC

## 2020-04-17 MED ORDER — FLUOXETINE HCL 20 MG PO TABS: 20.0000 mg | ORAL_TABLET | Freq: Every day | ORAL | 2 refills | Status: DC

## 2020-04-17 MED ORDER — BUSPIRONE HCL 7.5 MG PO TABS: 7.5000 mg | ORAL_TABLET | Freq: Three times a day (TID) | ORAL | 0 refills | Status: DC

## 2020-04-17 MED ORDER — FLUTICASONE PROPIONATE HFA 44 MCG/ACT IN AERO: 2.0000 | INHALATION_SPRAY | Freq: Every day | RESPIRATORY_TRACT | 12 refills | Status: DC

## 2020-04-17 MED ORDER — METRONIDAZOLE 500 MG PO TABS: 500.0000 mg | ORAL_TABLET | Freq: Three times a day (TID) | ORAL | 0 refills | Status: DC

## 2020-04-17 NOTE — Patient Instructions (Signed)
For your mood, I have prescribed Prozac as well as BuSpar.  You will take the Prozac once daily and the BuSpar 3 times daily.  Also recommend that she speak with our behavioral health specialist, Dr. Shawnee Knapp for counseling to help with your depression symptoms.  Please also begin using the Flovent inhaler daily to help prevent asthma symptoms.  Please follow-up with me in 2 weeks for mood check and asthma follow-up.

## 2020-04-17 NOTE — Progress Notes (Signed)
SUBJECTIVE:   CHIEF COMPLAINT / HPI: physical   Patient reports having vaginal discharge that she is concerned may be bacterial vaginosis.  She denies any dysuria.  She denies any abnormal bleeding.  Patient denies any vaginal odor that accompanies white discharge.  She denies any itching or vulvar irritation.  Insomnia Patient states that she often sleeps 2 to 3 hours per night.  She reports that she lies in bed after watching TV and taking a shower is unable to sleep more than a few hours at a time.  She reports her sleep appears to be related to her depression symptoms.  Patient recently lost and on near the end of 2021 and thinks about this and the passing of her son  Patient states that she was previously prescribed Prozac but never picked up the prescription.  She is interested in starting with counseling as well as taking medication.  She denies thoughts of suicidal ideation or audiovisual hallucinations.  Depression  Patient reports that she continues to feel depressed.  She states that she never picked up the Prozac prescription from her visit last year.  Patient reports that her depression was increased after the death of her aunt.  She continues to think about her son who passed away years ago.  Patient is agreeable to establishing care with a therapist.  She would like to start a medication and is interested in additional medication that she can use as needed.  She denies suicidal or homicidal ideation.  She states that her depression is becoming more of an issue as she often does not want to get out of bed in the mornings and will lie in a dark room.  She states that she is also having trouble sleeping and thinks that her mood has something to do with it.  Healthcare maintenance Patient is agreeable to hepatitis C  screening today.  Patient reporting for Pap smear.  She is also agreeable to lipid panel in complete metabolic panel given obesity.  PERTINENT  PMH / PSH:   Asthma Headache Major depressive disorder  OBJECTIVE:   BP 128/80   Pulse 91   Wt 253 lb 3.2 oz (114.9 kg)   SpO2 99%   BMI 44.85 kg/m   General: female appearing stated age in no acute distress HEENT: MMM, no oral lesions noted,Neck non-tender without lymphadenopathy Cardio: Normal S1 and S2, no S3 or S4. Rhythm is regular. No murmurs or rubs.  Bilateral radial pulses palpable Pulm: Clear to auscultation bilaterally, no crackles, wheezing, or diminished breath sounds. Normal respiratory effort Abdomen: Bowel sounds normal. Abdomen soft and non-tender.  Extremities: No peripheral edema. Warm & well perfused.  Psych: mood "ok", congruent affect, appropriately groomed wearing scrubs, black medium length haircut, no objective evidence of internal stimuli, patient denies SI/HI/AV hallucinations, thought content focused on insomnia and mood, appropriate eye contact, judgement intact   Genitalia:  Normal introitus for age, no external lesions, copious white vaginal discharge, mucosa pink and moist, no vaginal or cervical lesions, no vaginal atrophy, no friaility or hemorrhage, normal uterus size and position, no adnexal masses or tenderness   ASSESSMENT/PLAN:   Insomnia Discussed at length importance of sleep hygiene. Recommended that patient take warm showers 1 hour before bed, recommended increasing dose of melatonin and taking it around the time that she showers in order to allow time to relax before expecting to fall asleep, also recommended drinking teas to help with relaxation Discussed how managing depression with medication and therapy  could also help improve sleep, please see problem for depression  MDD (major depressive disorder) Patient with PHQ-9 score of 8 today.  Patient continues to report depressed mood and ruminations over the loss of family members including her son and recently her aunt in November 2021.  Patient is agreeable to starting fluoxetine and establishing with  therapy sessions here at [facility] and plans to transition to a more long-term therapist as outpatient. Patient given list of potential therapists Prescribed Prozac and BuSpar 7.5 mg Patient to follow-up in 2 weeks for mood check   Healthcare maintenance Hepatitis C screening Pap smear completed today Collect lipid panel and complete metabolic panel for obesity   Follow-up in 2 weeks  Ronnald Ramp, MD [facility]

## 2020-04-18 ENCOUNTER — Other Ambulatory Visit: Payer: Self-pay | Admitting: Family Medicine

## 2020-04-18 LAB — LIPID PANEL
Chol/HDL Ratio: 2.6 ratio (ref 0.0–4.4)
Cholesterol, Total: 146 mg/dL (ref 100–199)
HDL: 56 mg/dL (ref >39)
LDL Chol Calc (NIH): 79 mg/dL (ref 0–99)
Triglycerides: 51 mg/dL (ref 0–149)
VLDL Cholesterol Cal: 11 mg/dL (ref 5–40)

## 2020-04-18 LAB — COMPREHENSIVE METABOLIC PANEL
ALT: 20 IU/L (ref 0–32)
AST: 15 IU/L (ref 0–40)
Albumin/Globulin Ratio: 1.3 (ref 1.2–2.2)
Albumin: 4.1 g/dL (ref 3.8–4.8)
Alkaline Phosphatase: 99 IU/L (ref 44–121)
BUN/Creatinine Ratio: 13 (ref 9–23)
BUN: 11 mg/dL (ref 6–20)
Bilirubin Total: 0.4 mg/dL (ref 0.0–1.2)
CO2: 23 mmol/L (ref 20–29)
Calcium: 9.5 mg/dL (ref 8.7–10.2)
Chloride: 103 mmol/L (ref 96–106)
Creatinine, Ser: 0.87 mg/dL (ref 0.57–1.00)
GFR calc Af Amer: 98 mL/min/{1.73_m2} (ref >59)
GFR calc non Af Amer: 85 mL/min/{1.73_m2} (ref >59)
Globulin, Total: 3.2 g/dL (ref 1.5–4.5)
Glucose: 73 mg/dL (ref 65–99)
Potassium: 4.6 mmol/L (ref 3.5–5.2)
Sodium: 139 mmol/L (ref 134–144)
Total Protein: 7.3 g/dL (ref 6.0–8.5)

## 2020-04-18 LAB — HEPATITIS C ANTIBODY: Hep C Virus Ab: <0.1 s/co ratio (ref 0.0–0.9)

## 2020-04-18 MED ORDER — METRONIDAZOLE 500 MG PO TABS: 500.0000 mg | ORAL_TABLET | Freq: Three times a day (TID) | ORAL | 0 refills | Status: DC

## 2020-04-18 NOTE — Progress Notes (Signed)
Attempted to send prescription on day of visit 1/24. Called patient to clarify with no answer. Will re-send electronic script for Metronidazole for BV for 7 day treatment course. Please notify patient if she returns missed call.   Ronnald Ramp, MD [facility], PGY-2

## 2020-04-19 ENCOUNTER — Encounter: Payer: Self-pay | Admitting: Family Medicine

## 2020-04-19 ENCOUNTER — Telehealth: Payer: Self-pay | Admitting: Psychology

## 2020-04-19 DIAGNOSIS — Z0000 Encounter for general adult medical examination without abnormal findings: Secondary | ICD-10-CM | POA: Insufficient documentation

## 2020-04-19 NOTE — Telephone Encounter (Signed)
Called and left VM regarding BH appt

## 2020-04-19 NOTE — Assessment & Plan Note (Addendum)
Hepatitis C screening Pap smear completed today Collect lipid panel and complete metabolic panel for obesity

## 2020-04-19 NOTE — Assessment & Plan Note (Signed)
Discussed at length importance of sleep hygiene. Recommended that patient take warm showers 1 hour before bed, recommended increasing dose of melatonin and taking it around the time that she showers in order to allow time to relax before expecting to fall asleep, also recommended drinking teas to help with relaxation Discussed how managing depression with medication and therapy could also help improve sleep, please see problem for depression

## 2020-04-19 NOTE — Assessment & Plan Note (Signed)
Patient with PHQ-9 score of 8 today.  Patient continues to report depressed mood and ruminations over the loss of family members including her son and recently her aunt in November 2021.  Patient is agreeable to starting fluoxetine and establishing with therapy sessions here at [facility] and plans to transition to a more long-term therapist as outpatient. Patient given list of potential therapists Prescribed Prozac and BuSpar 7.5 mg Patient to follow-up in 2 weeks for mood check

## 2020-04-20 LAB — CYTOLOGY - PAP
Adequacy: ABSENT
Comment: NEGATIVE
Diagnosis: NEGATIVE
High risk HPV: NEGATIVE

## 2020-04-21 ENCOUNTER — Encounter: Payer: Self-pay | Admitting: Family Medicine

## 2020-04-25 ENCOUNTER — Encounter: Payer: Self-pay | Admitting: Family Medicine

## 2020-04-28 NOTE — Telephone Encounter (Signed)
Scheduled BH appt for 2/14 at 9am

## 2020-04-30 NOTE — Progress Notes (Signed)
    SUBJECTIVE:   CHIEF COMPLAINT / HPI: mood follow up   Patient expresses depressed mood that impacts her sleep. She was started on Prozac and buspar 2 weeks ago while being treated for BV.she reports that the nausea from the metronidazole has stopped.  She reports she has been regularly taking the BuSpar and fluoxetine.  She reports that the fluoxetine often makes her sleepy if she takes it by itself.  She has been taking this at night.  She reports that she does notice a difference in her mood and feels like her anxiety is lessened.  She plans to meet with Dr. Jolyne Loa for counseling session on 05/08/2020. PHQ9 score today 11 with negative response for suicidal ideation.   HM Patient counseled on influenza vaccine and COVID-19 vaccine. She declines influenza vaccine.  Patient presents her Covid-19 vaccination card with dates in May and June 2021.  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  She has work form with her today.  PERTINENT  PMH / PSH:  MDD   OBJECTIVE:   BP 118/72   Pulse 88   Ht 5\' 3"  (1.6 m)   Wt 251 lb 9.6 oz (114.1 kg)   LMP 04/21/2020 (Approximate)   SpO2 98%   BMI 44.57 kg/m   General: female appearing stated age in no acute distress Cardio: Normal S1 and S2, no S3 or S4. Rhythm is regular. No murmurs or rubs.  Bilateral radial pulses palpable Pulm: Clear to auscultation bilaterally, no crackles, wheezing, or diminished breath sounds. Normal respiratory effort, stable on room air Abdomen: Bowel sounds normal. Abdomen soft and non-tender. Extremities: No peripheral edema. Warm/ well perfused.   ASSESSMENT/PLAN:   MDD (major depressive disorder) Continue BuSpar and fluoxetine We will follow-up in 4 weeks for mood check as she will be 6-week mark We will follow with patient after appointment with Dr. 04/23/2020 for counseling Patient with improved mood, continues have PHQ-9 elevated with a score of 11  Healthcare maintenance Health  maintenance tab updated with Covid vaccination. Patient declines influenza vaccine     Jolyne Loa, MD [facility]

## 2020-05-02 ENCOUNTER — Ambulatory Visit (INDEPENDENT_AMBULATORY_CARE_PROVIDER_SITE_OTHER): Payer: Medicaid Other | Admitting: Family Medicine

## 2020-05-02 ENCOUNTER — Ambulatory Visit: Payer: Medicaid Other

## 2020-05-02 ENCOUNTER — Other Ambulatory Visit: Payer: Self-pay

## 2020-05-02 ENCOUNTER — Encounter: Payer: Self-pay | Admitting: Family Medicine

## 2020-05-02 DIAGNOSIS — F339 Major depressive disorder, recurrent, unspecified: Secondary | ICD-10-CM

## 2020-05-02 DIAGNOSIS — Z0000 Encounter for general adult medical examination without abnormal findings: Secondary | ICD-10-CM | POA: Diagnosis not present

## 2020-05-02 NOTE — Assessment & Plan Note (Signed)
Continue BuSpar and fluoxetine We will follow-up in 4 weeks for mood check as she will be 6-week mark We will follow with patient after appointment with Dr. Jolyne Loa for counseling Patient with improved mood, continues have PHQ-9 elevated with a score of 11

## 2020-05-02 NOTE — Patient Instructions (Addendum)
I am so glad to hear about your improved mood on the medications.  I think you are doing a good job with taking the medications as prescribed.  Please continue to take these as you have been and we can plan to follow-up in 4 weeks.  Please continue to follow-up with Dr. Shawnee Knapp for your appointment on next week.   Plan to follow-up with me in 4 weeks to check on your mood as well as your wheezing.  I do believe your wheezing will improve the more you decrease your smoking.

## 2020-05-02 NOTE — Assessment & Plan Note (Signed)
Health maintenance tab updated with Covid vaccination. Patient declines influenza vaccine

## 2020-05-03 ENCOUNTER — Ambulatory Visit: Payer: Medicaid Other

## 2020-05-05 ENCOUNTER — Ambulatory Visit: Payer: Medicaid Other

## 2020-05-08 ENCOUNTER — Ambulatory Visit: Payer: Medicaid Other | Admitting: Psychology

## 2020-05-22 ENCOUNTER — Ambulatory Visit: Payer: Medicaid Other | Admitting: Psychology

## 2020-05-22 ENCOUNTER — Other Ambulatory Visit: Payer: Self-pay

## 2020-05-22 ENCOUNTER — Ambulatory Visit (INDEPENDENT_AMBULATORY_CARE_PROVIDER_SITE_OTHER): Payer: Medicaid Other | Admitting: Psychology

## 2020-05-22 ENCOUNTER — Ambulatory Visit (INDEPENDENT_AMBULATORY_CARE_PROVIDER_SITE_OTHER): Payer: Medicaid Other

## 2020-05-22 DIAGNOSIS — F339 Major depressive disorder, recurrent, unspecified: Secondary | ICD-10-CM

## 2020-05-22 DIAGNOSIS — Z111 Encounter for screening for respiratory tuberculosis: Secondary | ICD-10-CM

## 2020-05-22 NOTE — BH Specialist Note (Signed)
Integrated Behavioral Health Follow Up In-Person Visit  MRN: [id number] Name: Molly Ford  Number of Integrated Behavioral Health Clinician visits: 1/6 Session Start time: 1015  Session End time: 11 Total time: 45  minutes  Types of Service: Individual psychotherapy   Subjective: Molly Ford is a 40 y.o. female  Patient was referred by Dr. Sharol HarnessRoxan Hockey for depression. Patient reports the following symptoms/concerns: Pt reported she is experiencing depression sx due to several losses in the past 10-15 years.  Pt shared she lost her baby son about 15 years ago.  Pt shared it was a sudden death due to medical condition.  She reported her close friend passed recently and she cares for her daughter.   Discussed loss process and emotional processing.  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  Reduce symptoms of: depression: insomnia; overwhelmed; racing thoughts; depressed mood 2.  Increase knowledge and/or ability of: self-management skills: self-care such as walking; talking through losses    Progress towards Goals: Ongoing  Interventions: Interventions utilized:  Supportive Reflection Standardized Assessments completed: PHQ 9  Patient and/or Family Response: Pt engaged in treatment plan   Patient Centered Plan: Patient is on the following Treatment Plan(s): depression treatment plan Assessment: Patient currently experiencing depression sx due to many losses in her life   Patient may benefit from trauma processing due to loss in life  Plan: 1. Follow up with behavioral health clinician on : 2 weeks 2. Behavioral  recommendations: self-care implementation 3. Referral(s): Integrated [employer] (In Clinic)  Royetta Asal, PhD., LMFT-A

## 2020-05-22 NOTE — Progress Notes (Signed)
Patient is here for a PPD placement.  PPD placed in left forearm @ 1020 am.  Patient will return 05/25/2020 to have PPD read.    Veronda Prude, RN

## 2020-05-25 ENCOUNTER — Ambulatory Visit (INDEPENDENT_AMBULATORY_CARE_PROVIDER_SITE_OTHER): Payer: Medicaid Other

## 2020-05-25 ENCOUNTER — Other Ambulatory Visit: Payer: Self-pay

## 2020-05-25 DIAGNOSIS — Z111 Encounter for screening for respiratory tuberculosis: Secondary | ICD-10-CM

## 2020-05-25 LAB — TB SKIN TEST
Induration: 0 mm
TB Skin Test: NEGATIVE

## 2020-05-25 NOTE — Progress Notes (Signed)
Patient is here for a PPD read.  It was placed on 05/22/2020 in the left forearm @ 1020 am.    PPD RESULTS:  Result: negative Induration: 0 mm  Letter created and given to patient for documentation purposes. Veronda Prude, RN

## 2020-06-07 ENCOUNTER — Ambulatory Visit: Payer: Medicaid Other | Admitting: Psychology

## 2020-06-08 ENCOUNTER — Other Ambulatory Visit: Payer: Self-pay | Admitting: Family Medicine

## 2020-06-08 MED ORDER — BUSPIRONE HCL 7.5 MG PO TABS: 7.5000 mg | ORAL_TABLET | Freq: Three times a day (TID) | ORAL | 1 refills | Status: AC

## 2020-06-21 ENCOUNTER — Ambulatory Visit (INDEPENDENT_AMBULATORY_CARE_PROVIDER_SITE_OTHER): Payer: Medicaid Other | Admitting: Psychology

## 2020-06-21 ENCOUNTER — Other Ambulatory Visit: Payer: Self-pay

## 2020-06-21 ENCOUNTER — Ambulatory Visit: Payer: Medicaid Other | Admitting: Psychology

## 2020-06-21 DIAGNOSIS — F339 Major depressive disorder, recurrent, unspecified: Secondary | ICD-10-CM

## 2020-06-21 NOTE — BH Specialist Note (Signed)
Integrated Behavioral Health Follow Up In-Person Visit  MRN: [id number] Name: Molly Ford  Number of Integrated Behavioral Health Clinician visits: 2/6 Session Start time: 2  Session End time: 245 Total time: 45  minutes  Types of Service: Individual psychotherapy   Subjective: Molly Ford is a 40 y.o. female  Patient was referred by Dr. Neita Garnet for depression. Patient reports the following symptoms/concerns: Pt reported improved mood due to implementations of self-care such as doing beads.  Pt shared her deceased son's birthday was derailer this month and she had some emotional flashbacks of his death.    Pt shared she feels her mind is "everywhere" at times.  Dicussed impacts of trauma event on depression and emotions. 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  Reduce symptoms of: depression: insomnia; overwhelmed; racing thoughts; depressed mood 2.  Increase knowledge and/or ability of: self-management skills: self-care such as walking; talking through losses    Progress towards Goals: Ongoing  Interventions: Interventions utilized:  Supportive Reflection Standardized Assessments completed: PHQ 9  Patient and/or Family Response: Pt engaged in treatment plan   Patient Centered Plan: Patient is on the following Treatment Plan(s): depression treatment plan Assessment: Patient currently experiencing depression sx due to many losses in her life   Patient may benefit from trauma processing due to loss in life  Plan: 1. Follow up with behavioral health clinician on : 2 weeks 2. Behavioral  recommendations: self-care implementation 3. Referral(s): Integrated [employer] (In Clinic)  Royetta Asal, PhD., LMFT-A

## 2020-07-04 ENCOUNTER — Other Ambulatory Visit: Payer: Self-pay | Admitting: Family Medicine

## 2020-07-04 ENCOUNTER — Encounter: Payer: Self-pay | Admitting: Family Medicine

## 2020-07-04 MED ORDER — LORATADINE 10 MG PO TABS: 10.0000 mg | ORAL_TABLET | Freq: Every day | ORAL | 1 refills | Status: AC

## 2020-07-05 ENCOUNTER — Ambulatory Visit: Payer: Medicaid Other | Admitting: Psychology

## 2020-07-12 ENCOUNTER — Encounter: Payer: Self-pay | Admitting: Family Medicine

## 2020-07-12 MED ORDER — ALBUTEROL SULFATE HFA 108 (90 BASE) MCG/ACT IN AERS: 2.0000 | INHALATION_SPRAY | Freq: Four times a day (QID) | RESPIRATORY_TRACT | 1 refills | Status: AC | PRN

## 2021-04-18 ENCOUNTER — Emergency Department (HOSPITAL_BASED_OUTPATIENT_CLINIC_OR_DEPARTMENT_OTHER)
Admission: EM | Admit: 2021-04-18 | Discharge: 2021-04-19 | Disposition: A | Discharge status: Home / Self Care | Payer: Medicaid Other | Attending: Emergency Medicine | Admitting: Emergency Medicine

## 2021-04-18 ENCOUNTER — Emergency Department (HOSPITAL_BASED_OUTPATIENT_CLINIC_OR_DEPARTMENT_OTHER): Payer: Medicaid Other

## 2021-04-18 ENCOUNTER — Encounter (HOSPITAL_BASED_OUTPATIENT_CLINIC_OR_DEPARTMENT_OTHER): Payer: Self-pay

## 2021-04-18 ENCOUNTER — Other Ambulatory Visit: Payer: Self-pay

## 2021-04-18 DIAGNOSIS — M5410 Radiculopathy, site unspecified: Secondary | ICD-10-CM

## 2021-04-18 DIAGNOSIS — M7601 Gluteal tendinitis, right hip: Secondary | ICD-10-CM | POA: Diagnosis not present

## 2021-04-18 DIAGNOSIS — Z79899 Other long term (current) drug therapy: Secondary | ICD-10-CM | POA: Insufficient documentation

## 2021-04-18 DIAGNOSIS — J4521 Mild intermittent asthma with (acute) exacerbation: Secondary | ICD-10-CM | POA: Diagnosis not present

## 2021-04-18 DIAGNOSIS — N83201 Unspecified ovarian cyst, right side: Secondary | ICD-10-CM

## 2021-04-18 DIAGNOSIS — R0602 Shortness of breath: Secondary | ICD-10-CM | POA: Diagnosis present

## 2021-04-18 DIAGNOSIS — R1031 Right lower quadrant pain: Secondary | ICD-10-CM | POA: Diagnosis not present

## 2021-04-18 DIAGNOSIS — Z20822 Contact with and (suspected) exposure to covid-19: Secondary | ICD-10-CM | POA: Diagnosis not present

## 2021-04-18 DIAGNOSIS — N83291 Other ovarian cyst, right side: Secondary | ICD-10-CM | POA: Insufficient documentation

## 2021-04-18 LAB — RESP PANEL BY RT-PCR (FLU A&B, COVID) ARPGX2
Influenza A by PCR: NEGATIVE
Influenza B by PCR: NEGATIVE
SARS Coronavirus 2 by RT PCR: NEGATIVE

## 2021-04-18 LAB — CBC WITH DIFFERENTIAL/PLATELET
Abs Immature Granulocytes: 0.03 10*3/uL (ref 0.00–0.07)
Basophils Absolute: 0.1 10*3/uL (ref 0.0–0.1)
Basophils Relative: 1 %
Eosinophils Absolute: 0.5 10*3/uL (ref 0.0–0.5)
Eosinophils Relative: 5 %
HCT: 38.7 % (ref 36.0–46.0)
Hemoglobin: 12.7 g/dL (ref 12.0–15.0)
Immature Granulocytes: 0 %
Lymphocytes Relative: 40 %
Lymphs Abs: 3.6 10*3/uL (ref 0.7–4.0)
MCH: 26.2 pg (ref 26.0–34.0)
MCHC: 32.8 g/dL (ref 30.0–36.0)
MCV: 79.8 fL — ABNORMAL LOW (ref 80.0–100.0)
Monocytes Absolute: 0.7 10*3/uL (ref 0.1–1.0)
Monocytes Relative: 8 %
Neutro Abs: 4.2 10*3/uL (ref 1.7–7.7)
Neutrophils Relative %: 46 %
Platelets: 279 10*3/uL (ref 150–400)
RBC: 4.85 MIL/uL (ref 3.87–5.11)
RDW: 15.2 % (ref 11.5–15.5)
WBC: 9.1 10*3/uL (ref 4.0–10.5)
nRBC: 0 % (ref 0.0–0.2)

## 2021-04-18 LAB — URINALYSIS, ROUTINE W REFLEX MICROSCOPIC
Bilirubin Urine: NEGATIVE
Glucose, UA: NEGATIVE mg/dL
Hgb urine dipstick: NEGATIVE
Ketones, ur: NEGATIVE mg/dL
Leukocytes,Ua: NEGATIVE
Nitrite: NEGATIVE
Protein, ur: NEGATIVE mg/dL
Specific Gravity, Urine: 1.02 (ref 1.005–1.030)
pH: 6 (ref 5.0–8.0)

## 2021-04-18 LAB — HEPATIC FUNCTION PANEL
ALT: 16 U/L (ref 0–44)
AST: 15 U/L (ref 15–41)
Albumin: 3.8 g/dL (ref 3.5–5.0)
Alkaline Phosphatase: 69 U/L (ref 38–126)
Bilirubin, Direct: <0.1 mg/dL (ref 0.0–0.2)
Total Bilirubin: 0.4 mg/dL (ref 0.3–1.2)
Total Protein: 7.5 g/dL (ref 6.5–8.1)

## 2021-04-18 LAB — TROPONIN I (HIGH SENSITIVITY): Troponin I (High Sensitivity): 3 ng/L (ref <18)

## 2021-04-18 LAB — BASIC METABOLIC PANEL
Anion gap: 9 (ref 5–15)
BUN: 13 mg/dL (ref 6–20)
CO2: 20 mmol/L — ABNORMAL LOW (ref 22–32)
Calcium: 8.7 mg/dL — ABNORMAL LOW (ref 8.9–10.3)
Chloride: 106 mmol/L (ref 98–111)
Creatinine, Ser: 0.8 mg/dL (ref 0.44–1.00)
GFR, Estimated: >60 mL/min (ref >60)
Glucose, Bld: 97 mg/dL (ref 70–99)
Potassium: 3.5 mmol/L (ref 3.5–5.1)
Sodium: 135 mmol/L (ref 135–145)

## 2021-04-18 LAB — PREGNANCY, URINE: Preg Test, Ur: NEGATIVE

## 2021-04-18 MED ORDER — LIDOCAINE 5 % EX PTCH
1.0000 | MEDICATED_PATCH | CUTANEOUS | Status: DC
  Administered 2021-04-18: 20:00:00 1 via TRANSDERMAL
  Filled 2021-04-18: qty 1

## 2021-04-18 MED ORDER — KETOROLAC TROMETHAMINE 30 MG/ML IJ SOLN
30.0000 mg | Freq: Once | INTRAMUSCULAR | Status: AC
  Administered 2021-04-18: 21:00:00 30 mg via INTRAVENOUS
  Filled 2021-04-18: qty 1

## 2021-04-18 MED ORDER — DEXAMETHASONE SODIUM PHOSPHATE 10 MG/ML IJ SOLN
10.0000 mg | Freq: Once | INTRAMUSCULAR | Status: AC
  Administered 2021-04-18: 21:00:00 10 mg via INTRAVENOUS
  Filled 2021-04-18: qty 1

## 2021-04-18 MED ORDER — AMLODIPINE BESYLATE 2.5 MG PO TABS: 2.5000 mg | ORAL_TABLET | Freq: Every day | ORAL | 0 refills | Status: AC

## 2021-04-18 MED ORDER — ALBUTEROL SULFATE HFA 108 (90 BASE) MCG/ACT IN AERS
2.0000 | INHALATION_SPRAY | Freq: Once | RESPIRATORY_TRACT | Status: AC
  Administered 2021-04-18: 21:00:00 2 via RESPIRATORY_TRACT
  Filled 2021-04-18: qty 6.7

## 2021-04-18 MED ORDER — CYCLOBENZAPRINE HCL 10 MG PO TABS: 10.0000 mg | ORAL_TABLET | Freq: Two times a day (BID) | ORAL | 0 refills | Status: AC | PRN

## 2021-04-18 MED ORDER — IOHEXOL 300 MG/ML  SOLN
100.0000 mL | Freq: Once | INTRAMUSCULAR | Status: AC | PRN
  Administered 2021-04-18: 22:00:00 100 mL via INTRAVENOUS

## 2021-04-18 MED ORDER — PREDNISONE 10 MG PO TABS: 40.0000 mg | ORAL_TABLET | Freq: Every day | ORAL | 0 refills | Status: AC

## 2021-04-18 NOTE — ED Notes (Signed)
Lab notified to add-on hepatic function panel and troponin to previously collected labwork.

## 2021-04-18 NOTE — ED Triage Notes (Signed)
Pt c/o SOB/wheezing-states feels like her asthma-states she has been out of her inhaler for years-also c/o pain to right mid back pain started when she twisted 1/21-NAD-steady gait

## 2021-04-18 NOTE — ED Provider Notes (Signed)
[place] EMERGENCY DEPARTMENT Provider Note   CSN: [id number] Arrival date & time: 04/18/21  1904     History  Chief Complaint  Patient presents with   Shortness of Breath    Molly Ford is a 41 y.o. female.  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    1/21 was trying to hold vomit and twisted to get up and thinks did something, only vomited that one time (thinks bad meat from grocery store)-after twisting getting off of the couch has had severe pain right buttock  Right buttock radiating through top of leg, hx of back surgery L4-L5, thinks it happened from twisting getting off of couch, pain started Saturday after that > Pain 8/10.  Worse with movements.  Has not had anything for it yet-heating pad, advil, continued to get worse.  No loss of control of bowel or bladder, weakness, numbness, falls or trauma, no fever, no hx of IVDU or cancer.    Tuesday shortness of breath started, thinks the pain of buttock, messing with breathing.  Feels like triggered when up and moving.  Dyspnea on exertion, hearing wheezing.  Don't have an inhaler, has not had PCP in a while, has been recommended a pcp from obgyn office.  Infrequen prob with asthma, usually just this time of year.  Coughing when moving, otherwise no cough. No runny nose, sore throat, ody aches, fever.  No chest pain.  Symptoms feel like asthma she has had in the past.   No hx of DVT/PE, no recent surgeries, long trips, not on OCPs No fam hx of blood clots just htn  Had heart rhythm problems with second baby, just with pregnancy, was recommended to avoid pregnancy, has not had issues since then, son 93yo   Past Medical History:  Diagnosis Date   Arthritis 2014   Asthma    Dysrhythmia    RV outflow  tract ventricular tachycardia (in setting of 2003 and 2007 pregnancies)   Heart murmur     Home Medications Prior to Admission medications   Medication Sig Start Date End Date Taking? Authorizing Provider  amLODipine (NORVASC) 2.5 MG tablet Take 1 tablet (2.5 mg total) by mouth daily. 04/18/21 05/18/21 Yes Gareth Morgan, MD  cyclobenzaprine (FLEXERIL) 10 MG tablet Take 1 tablet (10 mg total) by mouth 2 (two) times daily as needed for muscle spasms. 04/18/21  Yes Gareth Morgan, MD  loratadine (CLARITIN) 10 MG tablet Take 1 tablet (10 mg total) by mouth daily. 07/04/20   Simmons-Robinson, Riki Sheer, MD  predniSONE (DELTASONE) 10 MG tablet Take 4 tablets (40 mg total) by mouth daily for 4 days. 04/18/21 04/22/21 Yes Gareth Morgan, MD  albuterol (VENTOLIN HFA) 108 (90 Base) MCG/ACT inhaler Inhale 2 puffs into the lungs every 6 (six) hours as needed for wheezing or shortness of breath. 07/12/20   Simmons-Robinson, Makiera, MD  busPIRone (BUSPAR) 7.5 MG tablet Take 1 tablet (7.5 mg total) by mouth 3 (three) times daily. 06/08/20   Simmons-Robinson, Makiera, MD  FLUoxetine (PROZAC) 20 MG tablet Take 1 tablet (20 mg total) by mouth daily. 04/17/20   Simmons-Robinson, Makiera, MD  fluticasone (FLOVENT HFA) 44 MCG/ACT inhaler Inhale 2 puffs into the lungs daily. 04/17/20   Simmons-Robinson, Riki Sheer, MD      Allergies    Patient has no  known allergies.    Review of Systems   Review of Systems  Physical Exam Updated Vital Signs BP (!) 160/95    Pulse 78    Temp 98.3 F (36.8 C) (Oral)    Resp 16    Ht 5\' 2"  (1.575 m)    Wt 123.8 kg    LMP 03/25/2021    SpO2 100%    BMI 49.93 kg/m  Physical Exam Vitals and nursing note reviewed.  Constitutional:      General: She is not in acute distress.    Appearance: She is well-developed. She is not diaphoretic.  HENT:     Head: Normocephalic and atraumatic.  Eyes:     Conjunctiva/sclera: Conjunctivae normal.  Cardiovascular:     Rate and Rhythm: Normal rate  and regular rhythm.     Heart sounds: Normal heart sounds. No murmur heard.   No friction rub. No gallop.  Pulmonary:     Effort: Pulmonary effort is normal. No respiratory distress.     Breath sounds: Normal breath sounds. No wheezing or rales.  Abdominal:     General: There is no distension.     Palpations: Abdomen is soft.     Tenderness: There is abdominal tenderness (RLQ). There is no guarding.  Musculoskeletal:        General: No tenderness.     Cervical back: Normal range of motion.     Comments: Tenderness right buttock, right upper leg  Skin:    General: Skin is warm and dry.     Findings: No erythema or rash.  Neurological:     Mental Status: She is alert and oriented to person, place, and time.     Comments: Normal strength bilateral LE Normal pulses    ED Results / Procedures / Treatments   Labs (all labs ordered are listed, but only abnormal results are displayed) Labs Reviewed  BASIC METABOLIC PANEL - Abnormal; Notable for the following components:      Result Value   CO2 20 (*)    Calcium 8.7 (*)    All other components within normal limits  CBC WITH DIFFERENTIAL/PLATELET - Abnormal; Notable for the following components:   MCV 79.8 (*)    All other components within normal limits  RESP PANEL BY RT-PCR (FLU A&B, COVID) ARPGX2  URINALYSIS, ROUTINE W REFLEX MICROSCOPIC  HEPATIC FUNCTION PANEL  PREGNANCY, URINE  TROPONIN I (HIGH SENSITIVITY)    EKG EKG Interpretation  Date/Time:  Wednesday April 18 2021 19:53:08 EST Ventricular Rate:  78 PR Interval:  146 QRS Duration: 99 QT Interval:  347 QTC Calculation: 396 R Axis:   54 Text Interpretation: Sinus rhythm ST elev, probable normal early repol pattern No significant change since last tracing Confirmed by Gareth Morgan [phone]) on 04/18/2021 8:07:33 PM  Radiology DG Chest 2 View  Result Date: 04/18/2021 CLINICAL DATA:  Short of breath EXAM: CHEST - 2 VIEW COMPARISON:  05/05/2015 FINDINGS: The heart  size and mediastinal contours are within normal limits. Both lungs are clear. The visualized skeletal structures are unremarkable. IMPRESSION: No active cardiopulmonary disease. Electronically Signed   By: Franchot Gallo M.D.   On: 04/18/2021 19:46   CT ABDOMEN PELVIS W CONTRAST  Result Date: 04/18/2021 CLINICAL DATA:  Right lower quadrant abdominal pain. EXAM: CT ABDOMEN AND PELVIS WITH CONTRAST TECHNIQUE: Multidetector CT imaging of the abdomen and pelvis was performed using the standard protocol following bolus administration of intravenous contrast. RADIATION DOSE REDUCTION: This exam was performed according to the  departmental dose-optimization program which includes automated exposure control, adjustment of the mA and/or kV according to patient size and/or use of iterative reconstruction technique. CONTRAST:  11mL OMNIPAQUE IOHEXOL 300 MG/ML  SOLN COMPARISON:  Abdominal CT dated 03/13/2018. FINDINGS: Lower chest: The visualized lung bases are clear. No intra-abdominal free air or free fluid. Hepatobiliary: No focal liver abnormality is seen. No gallstones, gallbladder wall thickening, or biliary dilatation. Pancreas: Unremarkable. No pancreatic ductal dilatation or surrounding inflammatory changes. Spleen: Normal in size without focal abnormality. Adrenals/Urinary Tract: The adrenal glands unremarkable. Mild bilateral renal cortical irregularity and scarring. There is no hydronephrosis on either side. A 1 cm right renal upper pole hypodense lesion, likely a cyst. The visualized ureters and urinary bladder appear unremarkable. Stomach/Bowel: Moderate stool throughout the colon. No bowel obstruction or active inflammation. The appendix is normal. Vascular/Lymphatic: The abdominal aorta and IVC unremarkable. No portal venous gas. There is no adenopathy. Reproductive: The uterus is anteverted and grossly unremarkable. There is a 2 cm right ovarian cyst. The left ovary is unremarkable. A tubal ligation clip  noted in the right hemipelvis. Other: None Musculoskeletal: No acute or significant osseous findings. IMPRESSION: 1. No acute intra-abdominal or pelvic pathology. Normal appendix. 2. A 2 cm right ovarian cyst. Electronically Signed   By: Anner Crete M.D.   On: 04/18/2021 22:30    Procedures Procedures    Medications Ordered in ED Medications  albuterol (VENTOLIN HFA) 108 (90 Base) MCG/ACT inhaler 2 puff (2 puffs Inhalation Given 04/18/21 2053)  dexamethasone (DECADRON) injection 10 mg (10 mg Intravenous Given 04/18/21 2108)  ketorolac (TORADOL) 30 MG/ML injection 30 mg (30 mg Intravenous Given 04/18/21 2105)  iohexol (OMNIPAQUE) 300 MG/ML solution 100 mL (100 mLs Intravenous Contrast Given 04/18/21 2211)    ED Course/ Medical Decision Making/ A&P                           Medical Decision Making Amount and/or Complexity of Data Reviewed Labs: ordered. Radiology: ordered.  Risk Prescription drug management.    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    Differential diagnosis for dyspnea includes ACS, PE, COPD exacerbation, CHF exacerbation, anemia, pneumonia, viral etiology such as COVID 19 infection, metabolic abnormality.  Chest x-ray was done and interpreted personally by me and radiology which showed no pulmonary edema, pneumonia, cardiomegaly, pneumothorax.Marland Kitchen EKG was evaluated by me which showed no STEMI, no pericarditis.   Patient is low risk Wells and PERC negative and doubt PE.  Denies CP, troponin negative, doubt ACS.  Suspect dyspnea and wheezing with exertion secondary to asthma. Plan on steroids, given inhaler.  Recommend close follow up with PCP.  Regarding right buttock pain radiating to the leg--noted to have RLQ tenderness on exam with consideration of pelvic  etiology, iliopsoas abscess, nephrolithiasis.  Doubt ovarian torsion given pain not colicky, not otherwise having abdominal pain, had pelvic done with OBGYN on 1/23 while symptoms present without concern for torsion at that time.  CT abdomen pelvis done given RLQ tenderness on exam to evaluate for above and evaluated by me and radiology and shows no evidence of appendicitis or other significant acute abnormalities. 2cm cyst on right ovary noted--has TVUS scheduled with OB and recommend keeping this appointment for further evaluation.    Suspect pain related to muscular  strain or radicular pain from disc herniation in back.  No red flags or signs of cauda equina, fracture, epidural abscess.  Pain has improved with toradol in the ED. Normal pulses RLE, no sign arterial thrombus. No asymmetric leg swelling and pain more in buttocks and not consistent with DVT.  Recommend steroids for asthma and radicular pain, given rx for flexeril, recommend ibuprofen/tylenol and PCP follow up. .           Final Clinical Impression(s) / ED Diagnoses Final diagnoses:  Mild intermittent asthma with exacerbation  Radicular pain of right lower extremity  Ovarian cyst, right    Rx / DC Orders ED Discharge Orders          Ordered    cyclobenzaprine (FLEXERIL) 10 MG tablet  2 times daily PRN        04/18/21 2319    predniSONE (DELTASONE) 10 MG tablet  Daily        04/18/21 2319    amLODipine (NORVASC) 2.5 MG tablet  Daily        04/18/21 2332              Gareth Morgan, MD 04/19/21 1018

## 2021-04-18 NOTE — ED Notes (Signed)
Patient transported to CT 

## 2021-04-18 NOTE — ED Notes (Signed)
Returned from CT.

## 2021-04-18 NOTE — ED Notes (Signed)
Pt transported to xray 

## 2021-04-18 NOTE — ED Notes (Signed)
Pt reports right lower back pain that begins closer to right hip, shoots down right leg, worse with movement.

## 2021-08-28 ENCOUNTER — Encounter: Payer: Self-pay | Admitting: *Deleted

## 2022-06-18 ENCOUNTER — Encounter (HOSPITAL_BASED_OUTPATIENT_CLINIC_OR_DEPARTMENT_OTHER): Payer: Self-pay | Admitting: Urology

## 2022-06-18 ENCOUNTER — Emergency Department (HOSPITAL_BASED_OUTPATIENT_CLINIC_OR_DEPARTMENT_OTHER)
Admission: EM | Admit: 2022-06-18 | Discharge: 2022-06-18 | Disposition: A | Discharge status: Home / Self Care | Payer: Medicaid Other | Attending: Emergency Medicine | Admitting: Emergency Medicine

## 2022-06-18 DIAGNOSIS — Z7951 Long term (current) use of inhaled steroids: Secondary | ICD-10-CM | POA: Insufficient documentation

## 2022-06-18 DIAGNOSIS — J45909 Unspecified asthma, uncomplicated: Secondary | ICD-10-CM | POA: Diagnosis not present

## 2022-06-18 DIAGNOSIS — Z1152 Encounter for screening for COVID-19: Secondary | ICD-10-CM | POA: Diagnosis not present

## 2022-06-18 DIAGNOSIS — J069 Acute upper respiratory infection, unspecified: Secondary | ICD-10-CM | POA: Diagnosis not present

## 2022-06-18 DIAGNOSIS — R059 Cough, unspecified: Secondary | ICD-10-CM | POA: Diagnosis present

## 2022-06-18 LAB — RESP PANEL BY RT-PCR (RSV, FLU A&B, COVID)  RVPGX2
Influenza A by PCR: NEGATIVE
Influenza B by PCR: NEGATIVE
Resp Syncytial Virus by PCR: NEGATIVE
SARS Coronavirus 2 by RT PCR: NEGATIVE

## 2022-06-18 MED ORDER — ONDANSETRON 4 MG PO TBDP
4.0000 mg | ORAL_TABLET | Freq: Three times a day (TID) | ORAL | 0 refills | Status: AC | PRN
Start: 2022-06-18 — End: ?

## 2022-06-18 MED ORDER — ALBUTEROL SULFATE HFA 108 (90 BASE) MCG/ACT IN AERS
2.0000 | INHALATION_SPRAY | RESPIRATORY_TRACT | 1 refills | Status: AC | PRN
Start: 2022-06-18 — End: ?

## 2022-06-18 NOTE — ED Notes (Signed)
ED Provider at bedside. 

## 2022-06-18 NOTE — ED Triage Notes (Signed)
Pt states runny nose, cough, sore throat, headache, and congestion that started yesterday Denies fever   H/o HTN and did not take morning meds

## 2022-06-18 NOTE — Discharge Instructions (Addendum)
You tested negative for COVID and flu today. Please take tylenol/ibuprofen for pain/fever, gargle with salt water.  You can also try TheraFlu or Mucinex over-the-counter for symptom relief.  I recommend close follow-up with PCP for reevaluation.  Please do not hesitate to return to emergency department if worrisome signs symptoms we discussed become apparent.

## 2022-06-18 NOTE — ED Notes (Signed)
Discharge paperwork reviewed entirely with patient, including Rx's and follow up care. Pain was under control. Pt verbalized understanding as well as all parties involved. No questions or concerns voiced at the time of discharge. No acute distress noted.   Pt ambulated out to PVA without incident or assistance.  

## 2022-06-18 NOTE — ED Provider Notes (Signed)
[facility] Provider Note   CSN: [id number] Arrival date & time: 06/18/22  0932     History  Chief Complaint  Patient presents with   Flu like symptoms    NEWELL HOFFART is a 42 y.o. female with a past medical history of asthma presents today for evaluation of flu-like symptoms.  Patient states she started to have cough, sore throat, runny nose, headache, ear pain this morning after waking up.  She denies any fever, chest pain.  She endorsed mild shortness of breath.  Patient states she was looking at nursing home and not sure if she had any sick contacts.  History of asthma not using her inhaler recently.  Endorses nausea without vomiting.  Denies bowel changes, urinary symptoms.  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 Authorizing Provider  albuterol (VENTOLIN HFA) 108 (90 Base) MCG/ACT inhaler Inhale 2 puffs into the lungs every 4 (four) hours as needed for wheezing or shortness of breath. 06/18/22  Yes Rex Kras, PA  loratadine (CLARITIN) 10 MG tablet Take 1 tablet (10 mg total) by mouth daily. 07/04/20   Simmons-Robinson, Riki Sheer, MD  ondansetron (ZOFRAN-ODT) 4 MG disintegrating tablet Take 1 tablet (4 mg total) by mouth every 8 (eight) hours as needed for nausea or vomiting. 06/18/22  Yes Rex Kras, PA  albuterol (VENTOLIN HFA) 108 (90 Base) MCG/ACT inhaler  Inhale 2 puffs into the lungs every 6 (six) hours as needed for wheezing or shortness of breath. 07/12/20   Simmons-Robinson, Makiera, MD  amLODipine (NORVASC) 2.5 MG tablet Take 1 tablet (2.5 mg total) by mouth daily. 04/18/21 05/18/21  Gareth Morgan, MD  busPIRone (BUSPAR) 7.5 MG tablet Take 1 tablet (7.5 mg total) by mouth 3 (three) times daily. 06/08/20   Simmons-Robinson, Riki Sheer, MD  cyclobenzaprine (FLEXERIL) 10 MG tablet Take 1 tablet (10 mg total) by mouth 2 (two) times daily as needed for muscle spasms. 04/18/21   Gareth Morgan, MD  FLUoxetine (PROZAC) 20 MG tablet Take 1 tablet (20 mg total) by mouth daily. 04/17/20   Simmons-Robinson, Makiera, MD  fluticasone (FLOVENT HFA) 44 MCG/ACT inhaler Inhale 2 puffs into the lungs daily. 04/17/20   Simmons-Robinson, Riki Sheer, MD      Allergies    Patient has no known allergies.    Review of Systems   Review of Systems Negative except as per HPI.  Physical Exam Updated Vital Signs BP (!) 180/107 (BP Location: Right Arm)   Pulse 88   Temp 98.4 F (36.9 C) (Oral)   Resp 18   Ht 5\' 2"  (1.575 m)   Wt 118.4 kg   SpO2 99%   BMI 47.74 kg/m  Physical Exam Vitals and nursing note reviewed.  Constitutional:      Appearance: Normal appearance.  HENT:  Head: Normocephalic and atraumatic.     Mouth/Throat:     Mouth: Mucous membranes are moist.  Eyes:     General: No scleral icterus. Cardiovascular:     Rate and Rhythm: Normal rate and regular rhythm.     Pulses: Normal pulses.     Heart sounds: Normal heart sounds.  Pulmonary:     Effort: Pulmonary effort is normal.     Breath sounds: Normal breath sounds.  Abdominal:     General: Abdomen is flat.     Palpations: Abdomen is soft.     Tenderness: There is no abdominal tenderness.  Musculoskeletal:        General: No deformity.  Skin:    General: Skin is warm.     Findings: No rash.  Neurological:     General: No focal deficit present.     Mental Status: She is alert.   Psychiatric:        Mood and Affect: Mood normal.     ED Results / Procedures / Treatments   Labs (all labs ordered are listed, but only abnormal results are displayed) Labs Reviewed  RESP PANEL BY RT-PCR (RSV, FLU A&B, COVID)  RVPGX2    EKG None  Radiology No results found.  Procedures Procedures    Medications Ordered in ED Medications - No data to display  ED Course/ Medical Decision Making/ A&P                             Medical Decision Making Risk Prescription drug management.   This patient presents to the ED for sore throat, body aches, this involves an extensive number of treatment options, and is a complaint that carries with a high risk of complications and morbidity.  The differential diagnosis includes flu, COVID, RSV, strep, pharyngitis, bronchitis, pneumonia, infectious etiology.  This is not an exhaustive list.  Lab tests: I ordered and personally interpreted labs.  The pertinent results include: Viral panel negative.  Imaging studies:  Problem list/ ED course/ Critical interventions/ Medical management: HPI: See above Vital signs within normal range and stable throughout visit. Laboratory/imaging studies significant for: See above. On physical examination, patient is afebrile and appears in no acute distress. This patient presents with symptoms suspicious for viral upper respiratory infection. Based on history and physical doubt sinusitis. COVID test was negative. Do not suspect underlying cardiopulmonary process. I considered, but think unlikely, pneumonia. Patient is nontoxic appearing and not in need of emergent medical intervention. Patient told to self isolate at home until symptoms subside for 72 hours. Recommended patient to take TheraFlu or Mucinex for symptom relief.  Follow-up with primary care physician for further evaluation and management.  Return to the ER if new or worsening symptoms. I have reviewed the patient home medicines and have  made adjustments as needed.  Cardiac monitoring/EKG: The patient was maintained on a cardiac monitor.  I personally reviewed and interpreted the cardiac monitor which showed an underlying rhythm of: sinus rhythm.  Additional history obtained: External records from outside source obtained and reviewed including: Chart review including previous notes, labs, imaging.  Consultations obtained:  Disposition Continued outpatient therapy. Follow-up with PCP recommended for reevaluation of symptoms. Treatment plan discussed with patient.  Pt acknowledged understanding was agreeable to the plan. Worrisome signs and symptoms were discussed with patient, and patient acknowledged understanding to return to the ED if they noticed these signs and symptoms. Patient was stable upon discharge.  This chart was dictated using voice recognition software.  Despite best efforts to proofread,  errors can occur which can change the documentation meaning.          Final Clinical Impression(s) / ED Diagnoses Final diagnoses:  Viral upper respiratory infection    Rx / DC Orders ED Discharge Orders          Ordered    ondansetron (ZOFRAN-ODT) 4 MG disintegrating tablet  Every 8 hours PRN        06/18/22 1106    albuterol (VENTOLIN HFA) 108 (90 Base) MCG/ACT inhaler  Every 4 hours PRN        06/18/22 1106              Rex Kras, [place] 06/18/22 1109    Malvin Johns, MD 06/18/22 1219

## 2023-01-20 IMAGING — DX DG CHEST 2V
2 series · 2 of 2 positions shown · non-contrast
Comparison: 05/05/2015

CLINICAL DATA: Short of breath

EXAM:
CHEST - 2 VIEW

[chest pa]
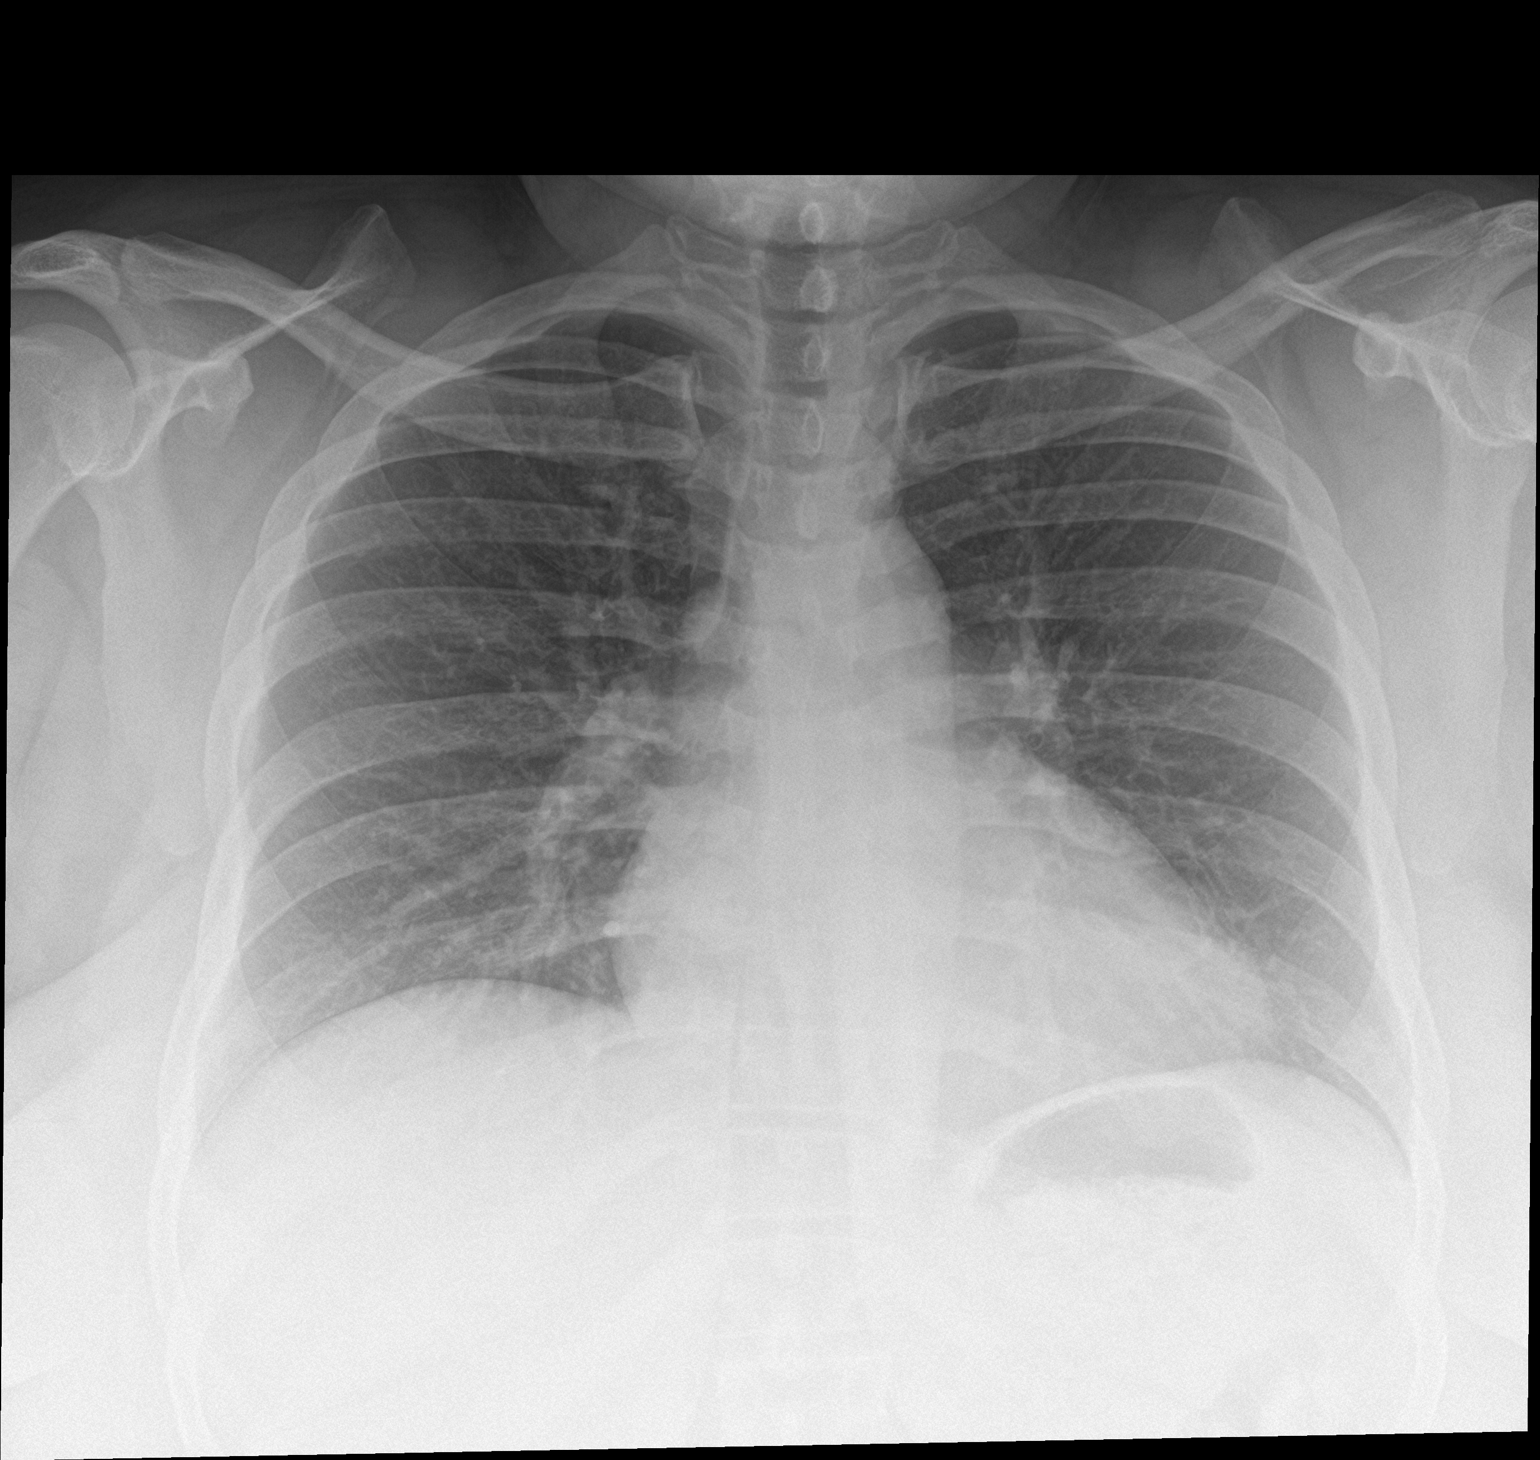

[chest lat]
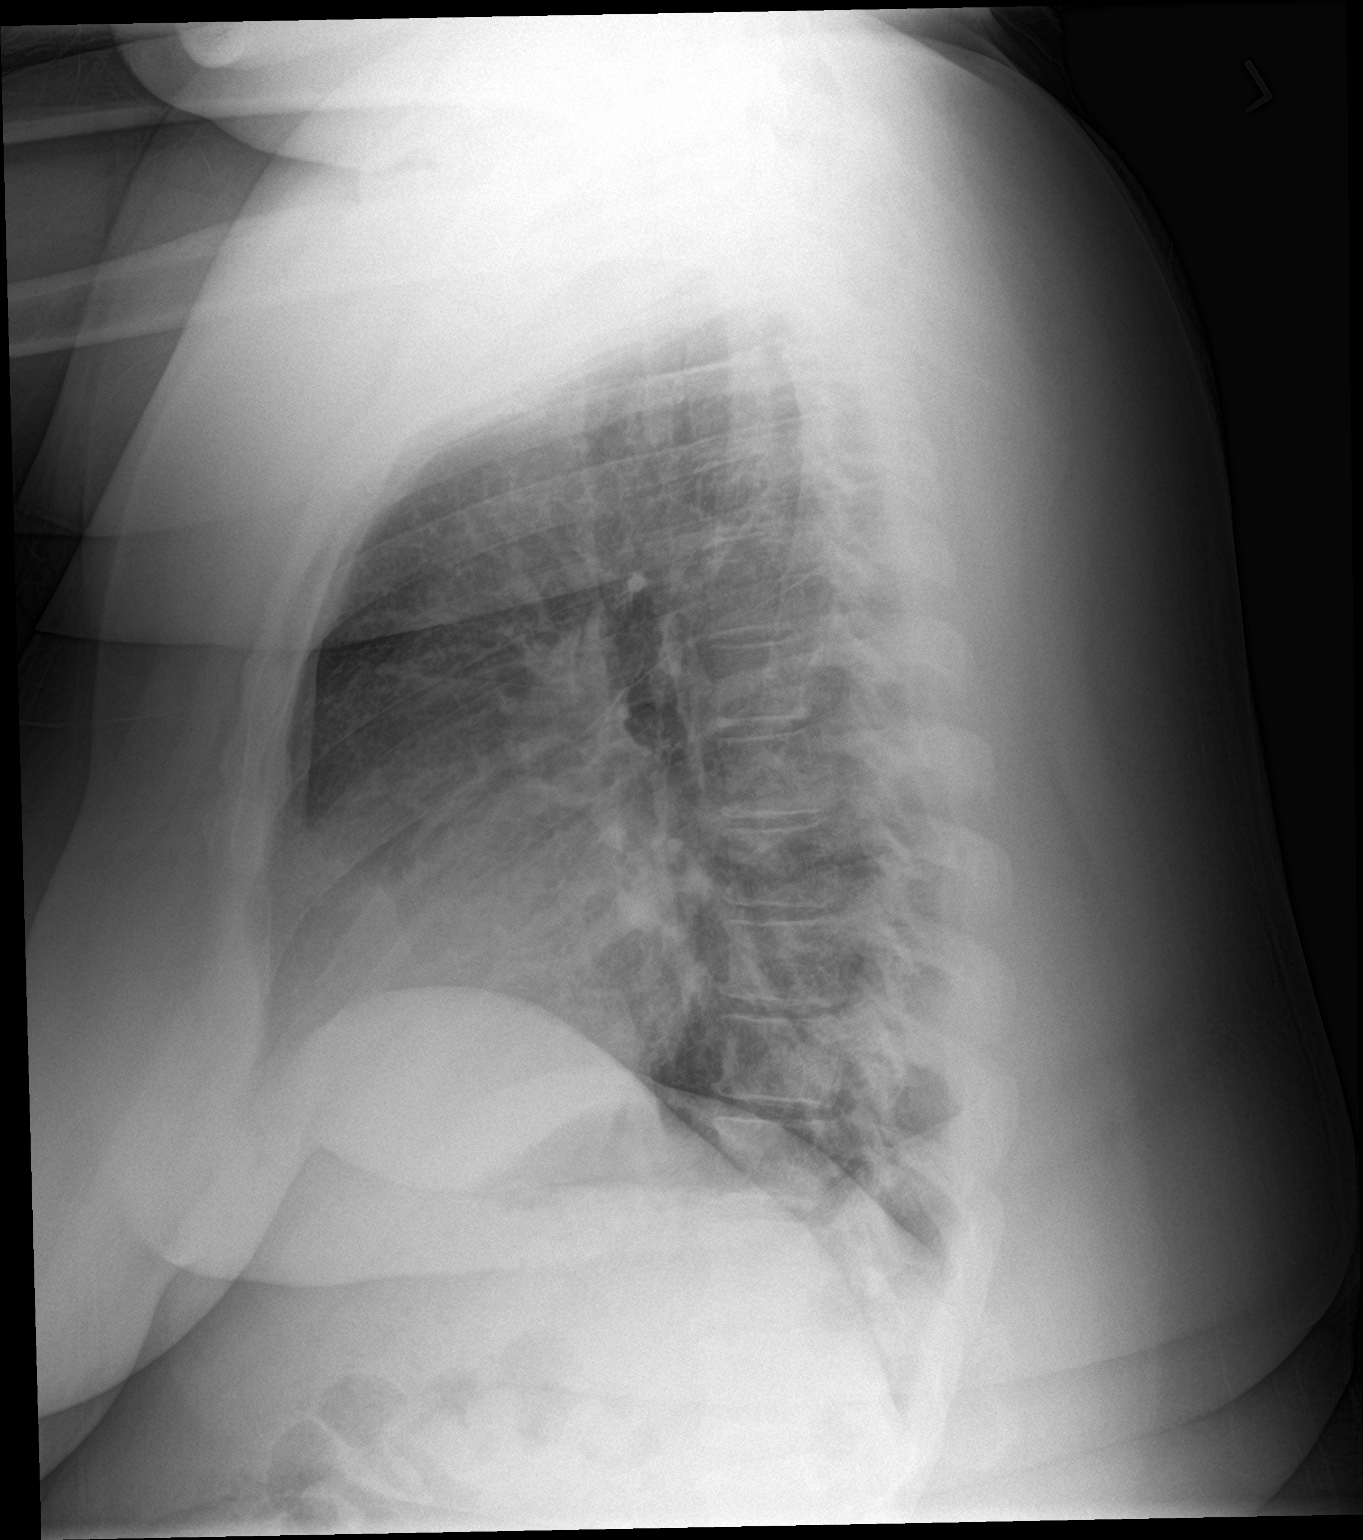

[2 of 2 positions shown; findings below may reference images not displayed]

FINDINGS: The heart size and mediastinal contours are within normal limits.
Both lungs are clear. The visualized skeletal structures are
unremarkable.
IMPRESSION: No active cardiopulmonary disease.

## 2023-01-20 IMAGING — CT CT ABD-PELV W/ CM
2 of 8 series · 14 of 46 positions shown, 18 images · IV contrast (Omnipaque)
Comparison: Abdominal CT dated 03/13/2018.

CLINICAL DATA: Right lower quadrant abdominal pain.

EXAM:
CT ABDOMEN AND PELVIS WITH CONTRAST
TECHNIQUE: Multidetector CT imaging of the abdomen and pelvis was performed
using the standard protocol following bolus administration of
intravenous contrast.

[Series 2: axial st · axial · 0.98mm/px · z∈[-380,+10]mm · 11 of 88 slices shown, 15 images]
[im 5/88  soft-tissue]
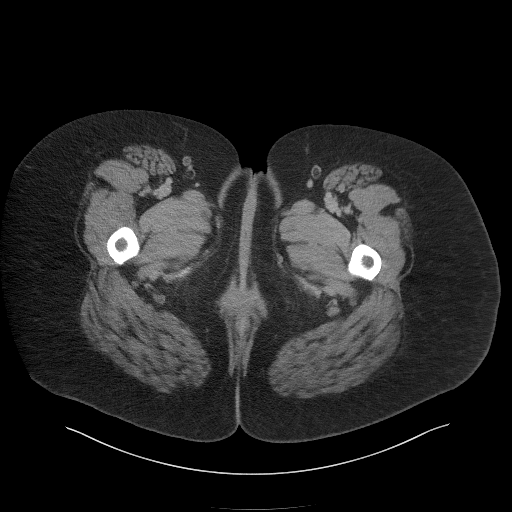
[im 5/88  bone]
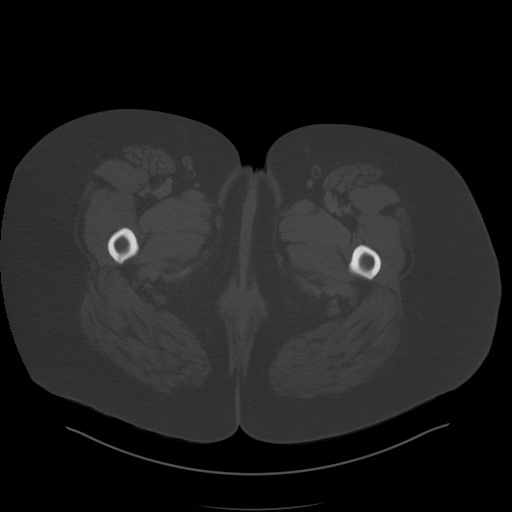
[im 15/88  soft-tissue]
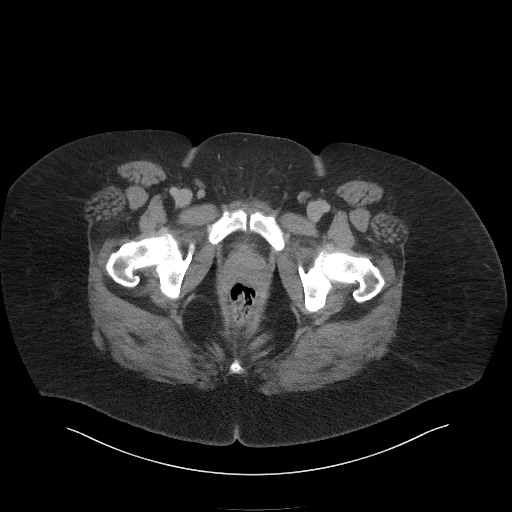
[im 25/88  soft-tissue]
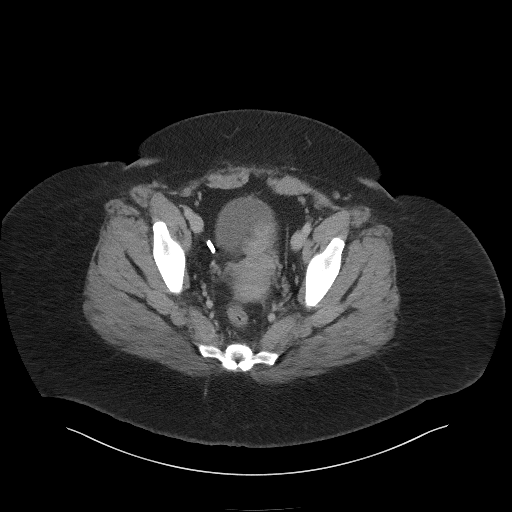
[im 34/88  soft-tissue]
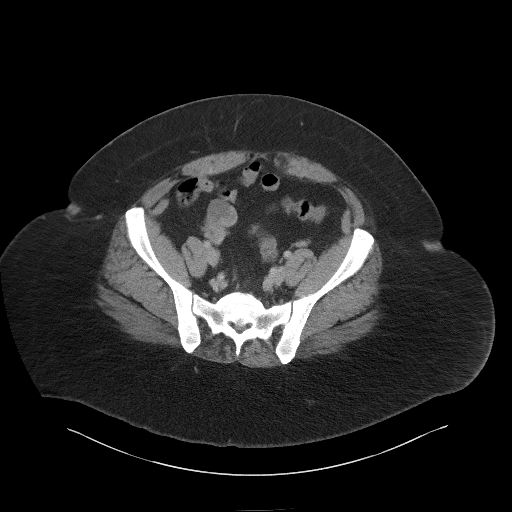
[im 44/88  soft-tissue]
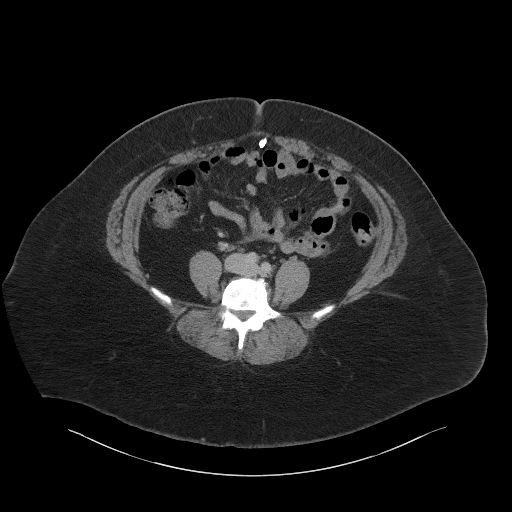
[im 54/88  soft-tissue]
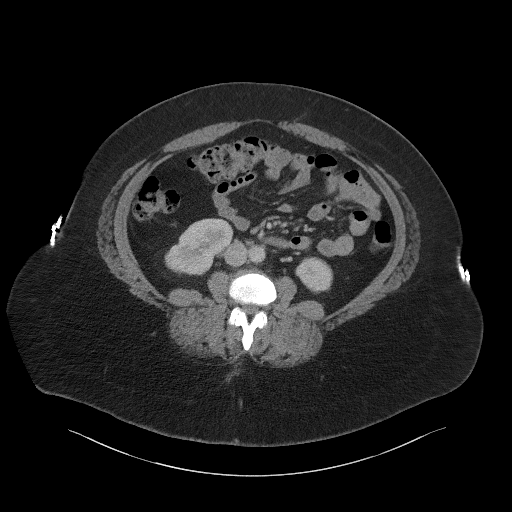
[im 63/88  soft-tissue]
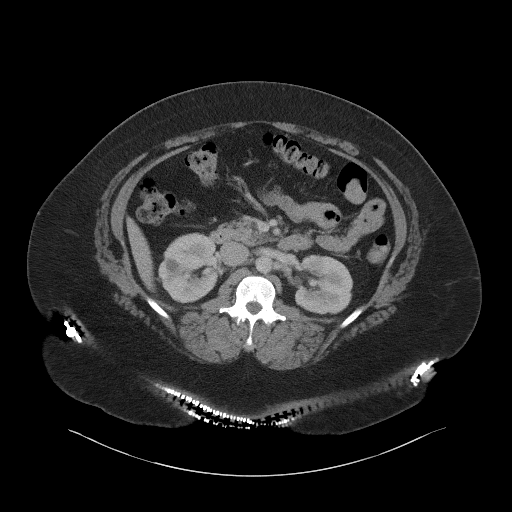
[im 68/88  lung]
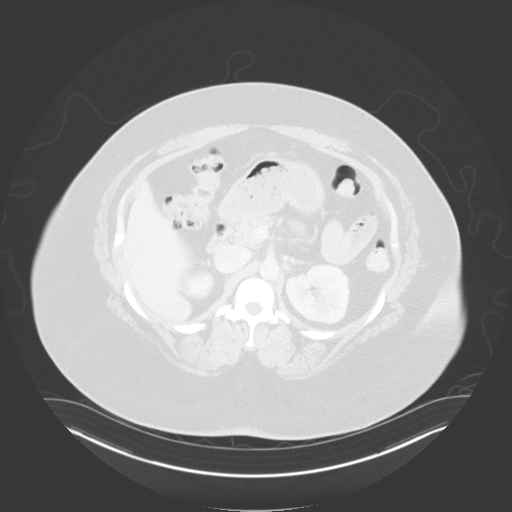
[im 73/88  soft-tissue]
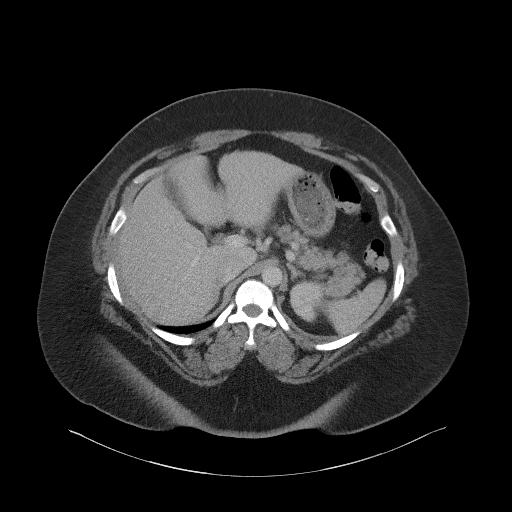
[im 73/88  lung]
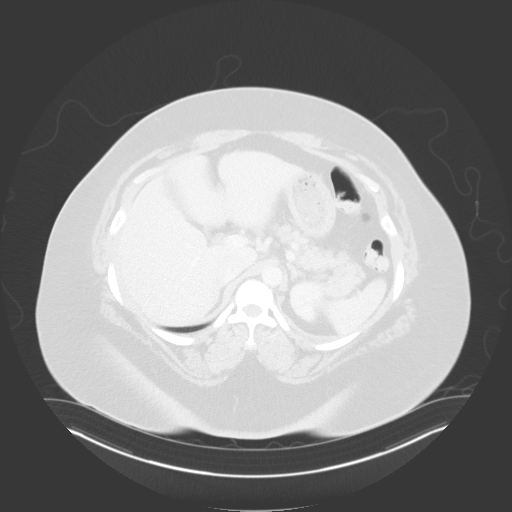
[im 78/88  lung]
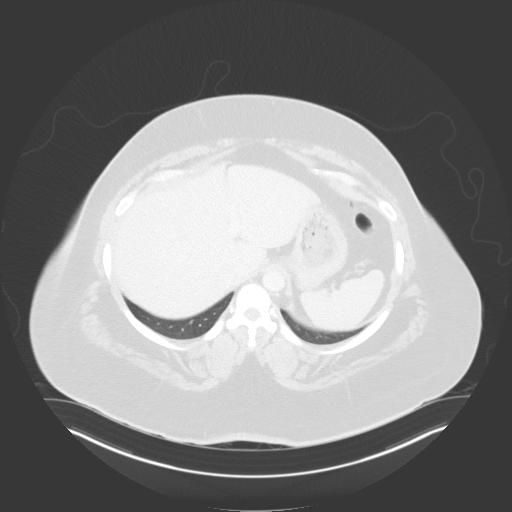
[im 83/88  soft-tissue]
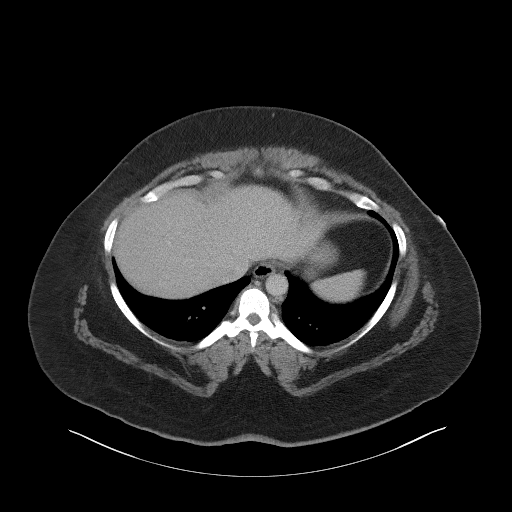
[im 83/88  lung]
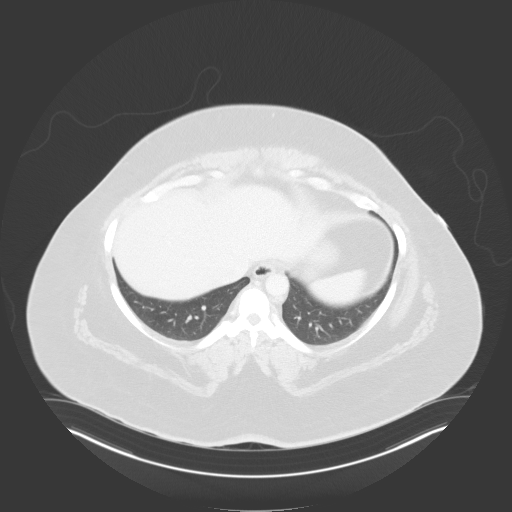
[im 83/88  bone]
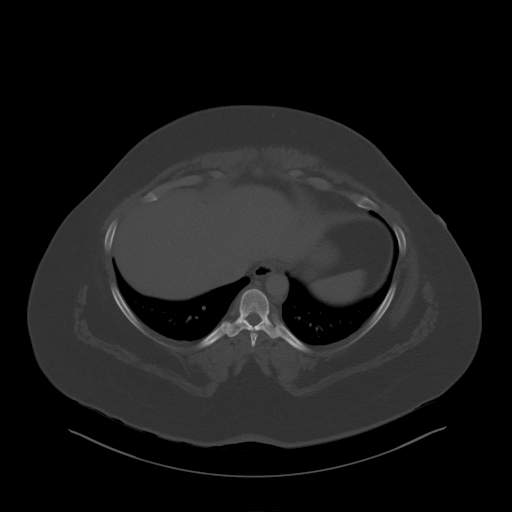

[Series 5: coronal st · coronal · 0.73mm/px · 3 of 95 slices shown]
[im 24/95  soft-tissue]
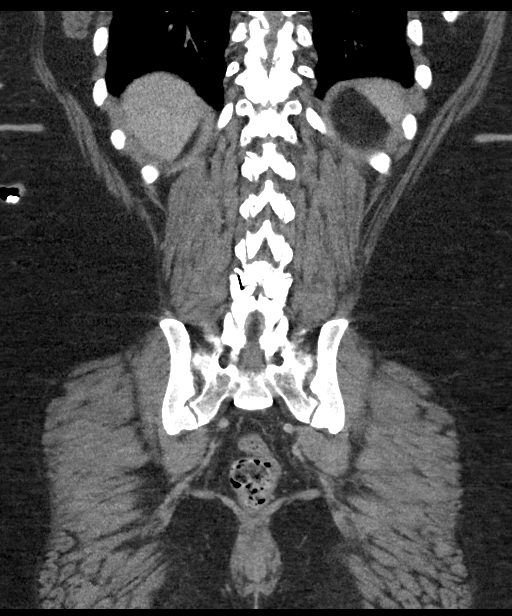
[im 48/95  soft-tissue]
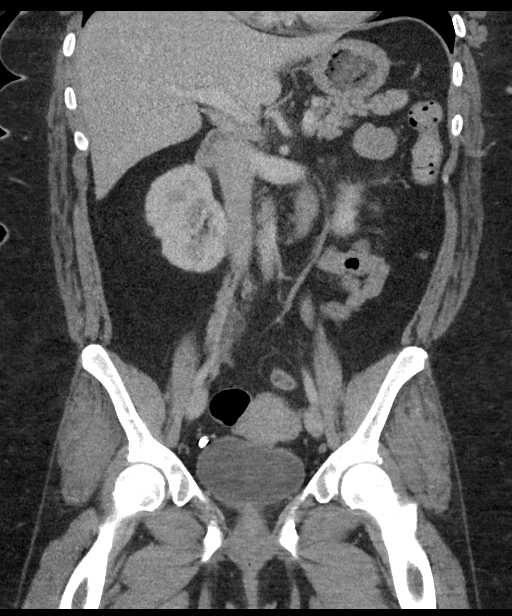
[im 71/95  soft-tissue]
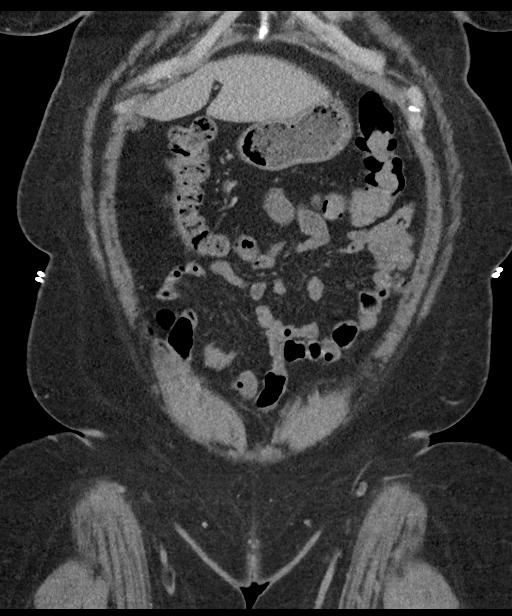

[14 of 46 positions shown; findings below may reference images not displayed]

RADIATION DOSE REDUCTION: This exam was performed according to the
departmental dose-optimization program which includes automated
exposure control, adjustment of the mA and/or kV according to
patient size and/or use of iterative reconstruction technique.

CONTRAST:  100mL OMNIPAQUE IOHEXOL 300 MG/ML  SOLN
FINDINGS: Lower chest: The visualized lung bases are clear.

No intra-abdominal free air or free fluid.

Hepatobiliary: No focal liver abnormality is seen. No gallstones,
gallbladder wall thickening, or biliary dilatation.

Pancreas: Unremarkable. No pancreatic ductal dilatation or
surrounding inflammatory changes.

Spleen: Normal in size without focal abnormality.

Adrenals/Urinary Tract: The adrenal glands unremarkable. Mild
bilateral renal cortical irregularity and scarring. There is no
hydronephrosis on either side. A 1 cm right renal upper pole
hypodense lesion, likely a cyst. The visualized ureters and urinary
bladder appear unremarkable.

Stomach/Bowel: Moderate stool throughout the colon. No bowel
obstruction or active inflammation. The appendix is normal.

Vascular/Lymphatic: The abdominal aorta and IVC unremarkable. No
portal venous gas. There is no adenopathy.

Reproductive: The uterus is anteverted and grossly unremarkable.
There is a 2 cm right ovarian cyst. The left ovary is unremarkable.
A tubal ligation clip noted in the right hemipelvis.

Other: None

Musculoskeletal: No acute or significant osseous findings.
IMPRESSION: 1. No acute intra-abdominal or pelvic pathology. Normal appendix.
2. A 2 cm right ovarian cyst.

## 2023-10-28 ENCOUNTER — Other Ambulatory Visit: Payer: Self-pay | Admitting: Radiology

## 2023-10-29 LAB — SURGICAL PATHOLOGY

## 2024-02-05 NOTE — Progress Notes (Signed)
 [facility]  - INTERNAL MEDICINE [place] 02/05/24 Annual Complete Physical Exam Molly Ford DOB: Apr 10, 1980  Encounter Provider:Andrea Margretta Sheffield, PA-C  PCP: Alfonso Margretta Hutchinson, PA-C  Subjective:   Molly Ford is a 43 y.o. female who presents today for annual physical exam.   Living arrangements - the patient lives alone.  Patient reports lateral right elbow pain for the last 2 days Denies any injury Reports that it hurts to move her arm Denies any swelling or redness Has not tried anything for this pain yet   Hypertension Patient is here for follow-up of elevated blood pressure.  She is NOT taking amlodipine  She feels like it was making her sleepy so she stopped the med. Of note, however, patient continues to smoke marijuana regularly and also felt the marijuana use could have contributed to her drowsiness, not just the amlodipine . Also of note, patient is working 10a-4p, going to school 6p-9p, and working a second job 11p-7a a few days a week. Due to this, she is only sleeping when she can and does not have a routine, sufficient sleep schedule.  Home blood pressures have been -- not checking BP Readings from Last 3 Encounters:  02/05/24 (!) 172/106  12/01/23 (!) 172/100  05/04/21 130/90   Subclinical hypothyroidism Current thyroid medication: NONE, Feeling well w/o complaints. Denies excessive fatigue, weight changes, heat/cold intolerance, bowel/skin changes. Lab Results  Component Value Date   TSH 7.742 (H) 12/01/2023   Vitamin d deficiency Known h/o vitamin d deficiency.  Current supplementation reviewed: 50000 weekly.    GYN:  Cervical cancer screening: approximate date 03/2020 and was normal.  She has not had a history of abnormal Pap smears. Patient's last menstrual period was 02/05/2024.. Mammogram has been completed within the last year.  She has a history of breast biopsy.  That was apparently negative.  We requested  records from [facility] at last visit but did not receive them.  We will rerequest. DEXA scan has not been completedwithin past 2 years. Colorectal cancer screening includes no previous colonoscopy.  No family history of colon cancer.  Patient has had a dental visit within the lastyear.   The patient reports that they have had an eye examination within the last year.   Patient's hearing is Normal  Immunizations needed: influenza, HPV -- patient declines both today  Immunizations by Immunization Family     Covid-19 Vaccine Unspecified 08/18/2019 (43 y.o.) 09/17/2019 (43 y.o.)     DTP 05/22/1983 (2 m.o.) 08/15/1983 (5 m.o.) 12/12/1983 (9 m.o.) 07/22/1985 (28 m.o.)    09/20/1987 (43 y.o.)      Hep B, Unspecified 11/12/1994 (43 y.o.) 07/04/1995 (43 y.o.) 08/05/1996 (43 y.o.)    Influenza, Unspecified 02/08/1998 (43 y.o.)      MMR 08/20/1984 (17 m.o.) 08/16/1993 (43 y.o.)     OPV 05/22/1983 (2 m.o.) 08/15/1983 (5 m.o.) 12/12/1983 (9 m.o.) 07/22/1985 (28 m.o.)    09/20/1987 (43 y.o.)      PPD Test 04/12/2019 (43 y.o.) 05/22/2020 (43 y.o.) 08/16/2020 (43 y.o.)    TD (Adult), 2 Lf Tetanus Toxoid, Preservative Free 04/23/1994 (43 y.o.) 11/24/2003 (43 y.o.)     TDAP VACCINE (BOOSTRIX ,ADACEL) 7Y+ 01/20/2018 (43 y.o.) 07/12/2021 (43 y.o.)         Depression Screening:  Behavioral Health Screening  Patient Health Questionnaire-2 Score: 1 (02/05/2024  1:14 PM)  Patient Health Questionnaire-9 Score: 4 (02/05/2024  1:14 PM)    Patient's Depression screening is Negative   Depression Plan:  Normal/Negative Screening   Review of Systems: History obtained from the patient. ROS negative except those noted in the HPI or elsewhere in note.  Objective:     Vitals:   02/05/24 1317  BP: (!) 172/106  Pulse:   Resp:   Temp:     Wt Readings from Last 3 Encounters:  02/05/24 114 kg (251 lb)  12/01/23 117 kg (258 lb)  05/04/21 121 kg (267 lb)    Body mass index is 44.46 kg/m.  Physical  Exam Constitutional:      General: They are not in acute distress.    Appearance: Normal appearance. Normal weight.  HENT:     Head: Normocephalic and atraumatic.     Right Ear: Tympanic membrane, ear canal and external ear normal.     Left Ear: Tympanic membrane, ear canal and external ear normal.     Nose: Nose normal.     Mouth/Throat:     Mouth: Mucous membranes are moist.     Pharynx: No oropharyngeal exudate or posterior oropharyngeal erythema.  Eyes:  Possible mild exophthalmos bilaterally    Extraocular Movements: Extraocular movements intact.     Conjunctiva/sclera: Conjunctivae normal.     Pupils: Pupils are equal, round, and reactive to light.  Neck:     Thyroid: There is goiter present on exam. No thyroid mass, or thyroid tenderness.  Cardiovascular:     Rate and Rhythm: Normal rate and regular rhythm.     Heart sounds: Normal heart sounds. No murmur heard.    No friction rub. No gallop.  Pulmonary:     Effort: Pulmonary effort is normal. No respiratory distress.     Breath sounds: Normal breath sounds. No wheezing, rhonchi or rales.  Breasts: deferred to gyn  Abdominal:     General: Bowel sounds are normal.     Palpations: Abdomen is soft. There is no mass.     Tenderness: There is no abdominal tenderness. There is no guarding.  Genitourinary: deferred  to gyn Musculoskeletal:     Cervical back: Normal range of motion and neck supple. No tenderness.  Lymphadenopathy:     Cervical: No cervical adenopathy.     Upper Body:     Right upper body: No axillary or pectoral adenopathy.     Left upper body: No axillary or pectoral adenopathy.  Skin:    General: Skin is warm and dry.  Neurological:     General: No focal deficit present.     Mental Status: She is alert.  Psychiatric:        Mood and Affect: Mood normal.        Behavior: Behavior normal.    Assessment/Plan:   1. Annual physical exam (Primary) - Urinalysis with Reflex to Microscopic; Future -  Vitamin D, 25-Hydroxy; Future - TSH With Reflex To Free T4; Future - Lipid Panel; Future - Comprehensive Metabolic Panel; Future - CBC with Differential; Future  Normal exam. Health maintenance updated when appropriate. Will get below fasting labs. Provided information on 4 principles of healthy living (Do not smoke; BMI<30; 150 minutes of exercise per week; at least 5 servings of fruits or vegetables daily).     2. Screening for lipid disorders - Lipid Panel; Future - Lipid Panel  3. Right elbow pain - meloxicam (MOBIC) 15 mg tablet; Take 1 tablet (15 mg total) by mouth daily. For 7 days for elbow pain. Then take only as needed.  Dispense: 30 tablet; Refill: 0  Most c/w tendinitis Recommend meloxicam,  icing, stretches provided in AVS Continue to monitor, if worsens consider xray/PT  4. Subclinical hypothyroidism 5. Goiter - Thyroglobulin Antibody - Thyroid Peroxidase (TPO) Antibodies  She is not reporting symptoms such as weight gain, constipation, hair loss, cold intolerance, however, she does have a possible mild exophthalmos and goiter, and does have fatigue (albeit she is not getting sufficient sleep) Will further evaluate with TSH and antibody/TPO labs today  If TSH still >7, consider starting levothyroxine  6. Primary hypertension - amLODIPine  (NORVASC ) 10 mg tablet; Take 1 tablet (10 mg total) by mouth daily.  Dispense: 30 tablet; Refill: 2  Uncontrolled Is not taking amlodipine   Advised to restart and monitor bp at home  7. Mild persistent asthma without complication (CMD) - fluticasone  propion-salmeteroL (ADVAIR DISKUS) 100-50 mcg/dose diskus inhaler; Inhale 1 puff in the morning and 1 puff before bedtime.  Dispense: 60 each; Refill: 3  Chronic; stable. Continue current treatment plan. Will call with any necessary medication changes.   8. Vitamin D deficiency - Vitamin D, 25-Hydroxy; Future - Vitamin D, 25-Hydroxy  Taking weekly Update today  9.  Marijuana  user Again requested patient to cut back frequency of use Reminded patient that marijuana can contribute to fatigue   Orders Placed This Encounter  Procedures   Urinalysis with Reflex to Microscopic   Vitamin D, 25-Hydroxy   TSH With Reflex To Free T4   Lipid Panel   Comprehensive Metabolic Panel   CBC with Differential   Thyroglobulin Antibody   Thyroid Peroxidase (TPO) Antibodies   CBC with Differential    The [place]  Controlled Substances Reporting System has been reviewed today as part of our clinic protocol. Patient expresses understanding of their current medications and use.  If a new prescription was given today, then I discussed potential side effects, drug interactions, instructions for taking the medication, and the consequences of not taking it.   Patient verbalized an understanding of these instructions, and of the care plan discussed today.  Return in about 10 weeks (around 04/15/2024) for htn f/u . Call sooner if need arises. Future Appointments  Date Time Provider Department Center  04/15/2024  3:20 PM Alfonso Catalina Clay, DEV[facility] [address]        I agree the documentation is accurate and complete. This document may be in part or whole created using the aid of voice recognition Dragon dictation software.   I have personally spent 40 minutes involved in face-to-face and non-face-to-face activities for this patient on the day of the visit.  Professional time spent includes the following activities, in addition to those noted in the documentation: Preparing to see the patient, Obtaining and/or reviewing separately obtained history, Referring and communicating with other health care professionals, Independently interpreting results, Care coordination.   Electronically signed by:  Alfonso Catalina Hutchinson, PA-C, 02/05/2024 2:45 PM   This document serves as a record of services personally performed by Alfonso Sheffield, PA.  It was  created on their behalf by Ranelle Aurora, RMA, a trained medical scribe, and Certified Medical Assistant (CMA). During the course of documenting the history, physical exam and medical decision making, I was functioning as a stage manager. The creation of this record is the providers dictation and/or activities during the visit.  Electronically signed by Ranelle Aurora, RMA 02/05/2024 12:24 PM

## 2024-02-06 NOTE — Telephone Encounter (Addendum)
-----   Message from Rosaline FALCON, [place] sent at 02/06/2024  1:18 PM EST ----- No mammo on file ----- Message ----- From: Ranelle Aurora, RMA Sent: 02/05/2024  12:24 PM EST To: [facility] Hm Request  Good Afternoon,   Can you please obtain pt mammogram, ultrasound report and breast biopsy from [facility] from 2024/2025.   Phone [phone]  Thank you Preet, RMA

## 2024-03-20 ENCOUNTER — Other Ambulatory Visit: Payer: Self-pay

## 2024-03-20 ENCOUNTER — Emergency Department (HOSPITAL_BASED_OUTPATIENT_CLINIC_OR_DEPARTMENT_OTHER)
Admission: EM | Admit: 2024-03-20 | Discharge: 2024-03-20 | Disposition: A | Discharge status: Home / Self Care | Attending: Emergency Medicine | Admitting: Emergency Medicine

## 2024-03-20 ENCOUNTER — Encounter (HOSPITAL_BASED_OUTPATIENT_CLINIC_OR_DEPARTMENT_OTHER): Payer: Self-pay | Admitting: Emergency Medicine

## 2024-03-20 DIAGNOSIS — J069 Acute upper respiratory infection, unspecified: Secondary | ICD-10-CM

## 2024-03-20 DIAGNOSIS — I10 Essential (primary) hypertension: Secondary | ICD-10-CM | POA: Insufficient documentation

## 2024-03-20 DIAGNOSIS — J101 Influenza due to other identified influenza virus with other respiratory manifestations: Secondary | ICD-10-CM | POA: Diagnosis not present

## 2024-03-20 DIAGNOSIS — Z7951 Long term (current) use of inhaled steroids: Secondary | ICD-10-CM | POA: Insufficient documentation

## 2024-03-20 DIAGNOSIS — J45909 Unspecified asthma, uncomplicated: Secondary | ICD-10-CM | POA: Insufficient documentation

## 2024-03-20 DIAGNOSIS — Z79899 Other long term (current) drug therapy: Secondary | ICD-10-CM | POA: Diagnosis not present

## 2024-03-20 DIAGNOSIS — R0602 Shortness of breath: Secondary | ICD-10-CM | POA: Diagnosis present

## 2024-03-20 LAB — RESP PANEL BY RT-PCR (RSV, FLU A&B, COVID)  RVPGX2
Influenza A by PCR: POSITIVE — AB
Influenza B by PCR: NEGATIVE
Resp Syncytial Virus by PCR: NEGATIVE
SARS Coronavirus 2 by RT PCR: NEGATIVE

## 2024-03-20 MED ORDER — IPRATROPIUM-ALBUTEROL 0.5-2.5 (3) MG/3ML IN SOLN
3.0000 mL | Freq: Once | RESPIRATORY_TRACT | Status: AC
  Administered 2024-03-20: 3 mL via RESPIRATORY_TRACT
  Filled 2024-03-20: qty 3

## 2024-03-20 MED ORDER — AMLODIPINE BESYLATE 5 MG PO TABS
10.0000 mg | ORAL_TABLET | Freq: Once | ORAL | Status: AC
  Administered 2024-03-20: 10 mg via ORAL
  Filled 2024-03-20: qty 2

## 2024-03-20 NOTE — Discharge Instructions (Addendum)
 You tested positive for influenza.  Please be aware that your blood pressure was very high in the emergency department today.  This may be due to several issues, including medication side effects, stress, discomfort, and illness.  However, untreated high blood pressure can lead to serious complications down the road -like kidney failure, heart disease, and  stroke. It is important that you are taking your blood pressure medicine every day as prescribed.  It is also important that you follow-up with your primary care doctor to keep monitoring your blood pressure.  You may need different medications to help control your blood pressure.

## 2024-03-20 NOTE — ED Provider Notes (Signed)
 " [place] [facility] Provider Note   CSN: [id number] Arrival date & time: 03/20/24  9172     Patient presents with: URI   Molly Ford is a 43 y.o. female with history of high blood pressure, asthma, presenting to ED with complaint of shortness of breath, congestion, headache, sore throat, ongoing for about 6 days.  Her symptoms began last Monday.  She works as a community education officer.  She does have a history of high blood pressure reports she did not take her medications yet this morning, just finished an overnight shift.  She is on amlodipine  10 mg.   HPI     Prior to Admission medications  Medication Sig Start Date End Date Taking? Authorizing Provider  loratadine  (CLARITIN ) 10 MG tablet Take 1 tablet (10 mg total) by mouth daily. 07/04/20   Simmons-Robinson, Rockie, MD  albuterol  (VENTOLIN  HFA) 108 (90 Base) MCG/ACT inhaler Inhale 2 puffs into the lungs every 6 (six) hours as needed for wheezing or shortness of breath. 07/12/20   Simmons-Robinson, Rockie, MD  albuterol  (VENTOLIN  HFA) 108 (90 Base) MCG/ACT inhaler Inhale 2 puffs into the lungs every 4 (four) hours as needed for wheezing or shortness of breath. 06/18/22   Ladora Congress, PA  amLODipine  (NORVASC ) 2.5 MG tablet Take 1 tablet (2.5 mg total) by mouth daily. 04/18/21 05/18/21  Dreama Longs, MD  busPIRone  (BUSPAR ) 7.5 MG tablet Take 1 tablet (7.5 mg total) by mouth 3 (three) times daily. 06/08/20   Simmons-Robinson, Rockie, MD  cyclobenzaprine  (FLEXERIL ) 10 MG tablet Take 1 tablet (10 mg total) by mouth 2 (two) times daily as needed for muscle spasms. 04/18/21   Dreama Longs, MD  FLUoxetine  (PROZAC ) 20 MG tablet Take 1 tablet (20 mg total) by mouth daily. 04/17/20   Simmons-Robinson, Rockie, MD  fluticasone  (FLOVENT  HFA) 44 MCG/ACT inhaler Inhale 2 puffs into the lungs daily. 04/17/20   Simmons-Robinson, Rockie, MD  ondansetron  (ZOFRAN -ODT) 4 MG disintegrating tablet Take 1 tablet (4 mg  total) by mouth every 8 (eight) hours as needed for nausea or vomiting. 06/18/22   Ladora Congress, PA    Allergies: Shellfish allergy    Review of Systems  Updated Vital Signs BP (!) 179/125 (BP Location: Right Arm)   Pulse 93   Temp 97.6 F (36.4 C)   Resp 18   Ht 5' 2 (1.575 m)   Wt 117.5 kg   LMP 03/07/2024   SpO2 98%   BMI 47.37 kg/m   Physical Exam Constitutional:      General: She is not in acute distress.    Appearance: She is obese.  HENT:     Head: Normocephalic and atraumatic.  Eyes:     Conjunctiva/sclera: Conjunctivae normal.     Pupils: Pupils are equal, round, and reactive to light.  Cardiovascular:     Rate and Rhythm: Normal rate and regular rhythm.  Pulmonary:     Effort: Pulmonary effort is normal. No respiratory distress.     Comments: Faint end expiratory wheezing, speaking full sentences, no rhonchorous breath sounds Abdominal:     General: There is no distension.     Tenderness: There is no abdominal tenderness.  Skin:    General: Skin is warm and dry.  Neurological:     General: No focal deficit present.     Mental Status: She is alert. Mental status is at baseline.  Psychiatric:        Mood and Affect: Mood normal.  Behavior: Behavior normal.     (all labs ordered are listed, but only abnormal results are displayed) Labs Reviewed  RESP PANEL BY RT-PCR (RSV, FLU A&B, COVID)  RVPGX2 - Abnormal; Notable for the following components:      Result Value   Influenza A by PCR POSITIVE (*)    All other components within normal limits    EKG: None  Radiology: No results found.   Procedures   Medications Ordered in the ED  amLODipine  (NORVASC ) tablet 10 mg (has no administration in time range)  ipratropium-albuterol  (DUONEB) 0.5-2.5 (3) MG/3ML nebulizer solution 3 mL (3 mLs Nebulization Given 03/20/24 0915)                                    Medical Decision Making Risk Prescription drug management.   Patient is here with  constellation of symptoms strongly suggestive of viral syndrome.  Influenza would be on the diagnosis differential.  She has some faint expiratory wheezing and feels that she would benefit from a breathing treatment, which we can offer in the ED.  She is not requiring steroids or antibiotics.  No indication for x-ray.  Doubt pneumonia or acute PE or sepsis.  I am recommending supportive care at home.  A work note was provided through Monday.  Patient's blood pressure was elevated today and she was given her home amlodipine  and advised to monitor her blood pressure at home.  Her influenza test is positive.  This is likely the cause of her symptoms.  Some symptomatic improvement after DuoNeb.  She is stable for discharge.     Final diagnoses:  Hypertension, unspecified type  Upper respiratory tract infection, unspecified type  Influenza A    ED Discharge Orders     None          Cottie Donnice PARAS, MD 03/20/24 [phone]  "

## 2024-03-20 NOTE — ED Triage Notes (Addendum)
 Cough, runny nose, body aches since Tuesday. Took Goody Powder x 1 hr ago. Just getting off work has not taken HTN med today
